# Patient Record
Sex: Female | Born: 1950 | Race: White | Hispanic: No | Marital: Married | Smoking: Never smoker
Health system: Southern US, Community
[De-identification: ages and names within clinical notes are randomized; demographics above are authoritative.]

## PROBLEM LIST (undated history)

## (undated) DIAGNOSIS — I10 Essential (primary) hypertension: Secondary | ICD-10-CM

## (undated) HISTORY — PX: ABDOMINAL HYSTERECTOMY: SHX81

## (undated) HISTORY — PX: APPENDECTOMY: SHX54

---

## 1973-12-11 HISTORY — PX: BREAST EXCISIONAL BIOPSY: SUR124

## 1999-07-26 ENCOUNTER — Ambulatory Visit (HOSPITAL_COMMUNITY): Admission: RE | Admit: 1999-07-26 | Discharge: 1999-07-26 | Payer: Self-pay | Admitting: Gastroenterology

## 1999-11-07 ENCOUNTER — Other Ambulatory Visit: Admission: RE | Admit: 1999-11-07 | Discharge: 1999-11-07 | Payer: Self-pay | Admitting: Gynecology

## 2000-01-26 ENCOUNTER — Other Ambulatory Visit: Admission: RE | Admit: 2000-01-26 | Discharge: 2000-01-26 | Payer: Self-pay | Admitting: Gynecology

## 2000-01-26 ENCOUNTER — Encounter (INDEPENDENT_AMBULATORY_CARE_PROVIDER_SITE_OTHER): Payer: Self-pay

## 2000-03-08 ENCOUNTER — Encounter: Payer: Self-pay | Admitting: Gynecology

## 2000-03-13 ENCOUNTER — Encounter (INDEPENDENT_AMBULATORY_CARE_PROVIDER_SITE_OTHER): Payer: Self-pay

## 2000-03-13 ENCOUNTER — Inpatient Hospital Stay (HOSPITAL_COMMUNITY): Admission: RE | Admit: 2000-03-13 | Discharge: 2000-03-14 | Payer: Self-pay | Admitting: Gynecology

## 2001-04-23 ENCOUNTER — Other Ambulatory Visit: Admission: RE | Admit: 2001-04-23 | Discharge: 2001-04-23 | Payer: Self-pay | Admitting: Gynecology

## 2001-08-15 ENCOUNTER — Emergency Department (HOSPITAL_COMMUNITY): Admission: EM | Admit: 2001-08-15 | Discharge: 2001-08-15 | Payer: Self-pay | Admitting: Emergency Medicine

## 2001-11-27 ENCOUNTER — Ambulatory Visit (HOSPITAL_COMMUNITY): Admission: RE | Admit: 2001-11-27 | Discharge: 2001-11-27 | Payer: Self-pay | Admitting: Gynecology

## 2001-11-27 ENCOUNTER — Encounter: Payer: Self-pay | Admitting: Gynecology

## 2002-02-21 ENCOUNTER — Ambulatory Visit (HOSPITAL_COMMUNITY): Admission: RE | Admit: 2002-02-21 | Discharge: 2002-02-21 | Payer: Self-pay | Admitting: Gastroenterology

## 2002-02-21 ENCOUNTER — Encounter (INDEPENDENT_AMBULATORY_CARE_PROVIDER_SITE_OTHER): Payer: Self-pay | Admitting: Specialist

## 2002-03-07 ENCOUNTER — Encounter: Payer: Self-pay | Admitting: Gastroenterology

## 2002-03-07 ENCOUNTER — Ambulatory Visit (HOSPITAL_COMMUNITY): Admission: RE | Admit: 2002-03-07 | Discharge: 2002-03-07 | Payer: Self-pay | Admitting: Gastroenterology

## 2002-05-07 ENCOUNTER — Other Ambulatory Visit: Admission: RE | Admit: 2002-05-07 | Discharge: 2002-05-07 | Payer: Self-pay | Admitting: Gynecology

## 2002-12-15 ENCOUNTER — Encounter: Payer: Self-pay | Admitting: Gynecology

## 2002-12-15 ENCOUNTER — Ambulatory Visit (HOSPITAL_COMMUNITY): Admission: RE | Admit: 2002-12-15 | Discharge: 2002-12-15 | Payer: Self-pay | Admitting: Gynecology

## 2002-12-18 ENCOUNTER — Encounter: Payer: Self-pay | Admitting: Gynecology

## 2002-12-18 ENCOUNTER — Encounter: Admission: RE | Admit: 2002-12-18 | Discharge: 2002-12-18 | Payer: Self-pay | Admitting: Gynecology

## 2003-05-25 ENCOUNTER — Other Ambulatory Visit: Admission: RE | Admit: 2003-05-25 | Discharge: 2003-05-25 | Payer: Self-pay | Admitting: Gynecology

## 2003-12-28 ENCOUNTER — Ambulatory Visit (HOSPITAL_COMMUNITY): Admission: RE | Admit: 2003-12-28 | Discharge: 2003-12-28 | Payer: Self-pay | Admitting: Gynecology

## 2004-06-29 ENCOUNTER — Other Ambulatory Visit: Admission: RE | Admit: 2004-06-29 | Discharge: 2004-06-29 | Payer: Self-pay | Admitting: Gynecology

## 2005-01-18 ENCOUNTER — Ambulatory Visit (HOSPITAL_COMMUNITY): Admission: RE | Admit: 2005-01-18 | Discharge: 2005-01-18 | Payer: Self-pay | Admitting: Gynecology

## 2005-08-17 ENCOUNTER — Other Ambulatory Visit: Admission: RE | Admit: 2005-08-17 | Discharge: 2005-08-17 | Payer: Self-pay | Admitting: Gynecology

## 2006-02-21 ENCOUNTER — Ambulatory Visit (HOSPITAL_COMMUNITY): Admission: RE | Admit: 2006-02-21 | Discharge: 2006-02-21 | Payer: Self-pay | Admitting: Gynecology

## 2006-03-22 ENCOUNTER — Encounter: Admission: RE | Admit: 2006-03-22 | Discharge: 2006-03-22 | Payer: Self-pay | Admitting: Gynecology

## 2006-09-09 ENCOUNTER — Emergency Department (HOSPITAL_COMMUNITY): Admission: EM | Admit: 2006-09-09 | Discharge: 2006-09-09 | Payer: Self-pay | Admitting: Emergency Medicine

## 2006-09-10 ENCOUNTER — Other Ambulatory Visit: Admission: RE | Admit: 2006-09-10 | Discharge: 2006-09-10 | Payer: Self-pay | Admitting: Gynecology

## 2006-09-27 ENCOUNTER — Encounter: Admission: RE | Admit: 2006-09-27 | Discharge: 2006-09-27 | Payer: Self-pay | Admitting: Emergency Medicine

## 2007-02-28 ENCOUNTER — Encounter: Admission: RE | Admit: 2007-02-28 | Discharge: 2007-02-28 | Payer: Self-pay | Admitting: Gynecology

## 2007-10-17 ENCOUNTER — Other Ambulatory Visit: Admission: RE | Admit: 2007-10-17 | Discharge: 2007-10-17 | Payer: Self-pay | Admitting: Gynecology

## 2008-03-26 ENCOUNTER — Encounter: Admission: RE | Admit: 2008-03-26 | Discharge: 2008-03-26 | Payer: Self-pay | Admitting: Gynecology

## 2008-04-08 ENCOUNTER — Encounter: Admission: RE | Admit: 2008-04-08 | Discharge: 2008-04-08 | Payer: Self-pay | Admitting: Gynecology

## 2008-12-28 ENCOUNTER — Other Ambulatory Visit: Admission: RE | Admit: 2008-12-28 | Discharge: 2008-12-28 | Payer: Self-pay | Admitting: Gynecology

## 2009-06-22 ENCOUNTER — Encounter: Admission: RE | Admit: 2009-06-22 | Discharge: 2009-06-22 | Payer: Self-pay | Admitting: Gynecology

## 2011-01-01 ENCOUNTER — Encounter: Payer: Self-pay | Admitting: Gynecology
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
## 2011-04-28 NOTE — Op Note (Signed)
[facility]  Patient:    Cheryl Gallegos, Cheryl Gallegos                  MRN: [id number] Proc. Date: 03/13/00 Attending:  Leatha Gilding. Mezer, M.D.                           Operative Report  PREOPERATIVE DIAGNOSES:  Postmenopausal bleeding, fibroid uterus.  POSTOPERATIVE DIAGNOSES:  Postmenopausal bleeding, fibroid uterus, adhesions.  PROCEDURE:  Total abdominal hysterectomy, bilateral salpingo-oophorectomy, and lysis of adhesions.  SURGEON:  Leatha Gilding. Mezer, M.D.  ASSISTANT:  Harl Bowie, M.D.  ANESTHESIA:  General endotracheal.  PREPARATION:  Betadine.  DESCRIPTION OF PROCEDURE:  This operative note was inadvertently not dictated at the time of surgery and is dictated late with the aid of the progress notes and will be dictated as usual and standard procedure without the particular details of this surgery.  With the patient in the supine position, she was prepped and draped in the routine fashion.  An incision was made through the skin and subcutaneous tissue.  The fascia and peritoneum were opened without difficulty.  The uterus was top normal in size with fibroids.  There were adhesions of bowel to the anterior peritoneum and bladder, and the tissue throughout was of very poor quality.  The adhesions were taken down to restore the operative field, and the round ligaments suture ligated with #1 chromic and divided with cautery.  The infundibulopelvic ligaments were isolated, taking care to locate the ureters.  The infundibulopelvic ligaments were clamped, cut, and free tied with #1 chromic and then suture ligated with #1 chromic.  The anterior leaf of the broad ligament is opened, the bladder flap taken down sharply and bluntly as indicated, and the uterine arteries clamped, cut, and suture ligated with #1 chromic.  The cardinal ligaments are taken in several bites, clamped, cut, and suture ligated with #1 chromic.  The vagina is entered from the best  and safest access, either anteriorly, posteriorly, or laterally, and the specimen excised with circumferential dissection.  _____ type angle sutures of #1 chromic are then placed, and the cuff is whipped anteriorly with running locked #1 chromic suture.  One or two anterior-posterior stitches are then taken to decrease the diameter of the opening of the vagina.  The bladder is then replaced over the vaginal cuff with a running 3-0 Vicryl suture.  The ureters are reinspected to be sure that they are out of harms way and with hemostasis intact, an effort is made to place the large bowel in the cul-de-sac.  The omentum is brought down.  The abdomen is closed in layers using a running 2-0 Vicryl on the peritoneum, running 0 Vicryl on the fascia.  Hemostasis is assured in the subcutaneous tissue, and the skin was closed with staples.  The estimated blood loss is 375 cc, with sponge, instrument, and needle counts correct x 2.  The patient tolerated the procedure well and was taken to the recovery room in satisfactory condition. DD:  08/09/00 TD:  08/10/00 Job: [id number]

## 2011-06-19 ENCOUNTER — Other Ambulatory Visit: Payer: Self-pay | Admitting: Family Medicine

## 2011-06-19 DIAGNOSIS — Z1231 Encounter for screening mammogram for malignant neoplasm of breast: Secondary | ICD-10-CM

## 2011-07-13 ENCOUNTER — Ambulatory Visit
Admission: RE | Admit: 2011-07-13 | Discharge: 2011-07-13 | Disposition: A | Discharge status: Home / Self Care | Payer: BC Managed Care – PPO | Source: Ambulatory Visit | Attending: Family Medicine | Admitting: Family Medicine

## 2011-07-13 DIAGNOSIS — Z1231 Encounter for screening mammogram for malignant neoplasm of breast: Secondary | ICD-10-CM

## 2012-10-31 ENCOUNTER — Other Ambulatory Visit: Payer: Self-pay | Admitting: Family Medicine

## 2012-10-31 DIAGNOSIS — Z1231 Encounter for screening mammogram for malignant neoplasm of breast: Secondary | ICD-10-CM

## 2012-12-12 ENCOUNTER — Ambulatory Visit
Admission: RE | Admit: 2012-12-12 | Discharge: 2012-12-12 | Disposition: A | Discharge status: Home / Self Care | Payer: BC Managed Care – PPO | Source: Ambulatory Visit | Attending: Family Medicine | Admitting: Family Medicine

## 2012-12-12 DIAGNOSIS — Z1231 Encounter for screening mammogram for malignant neoplasm of breast: Secondary | ICD-10-CM

## 2012-12-17 ENCOUNTER — Other Ambulatory Visit: Payer: Self-pay | Admitting: Family Medicine

## 2012-12-17 DIAGNOSIS — R928 Other abnormal and inconclusive findings on diagnostic imaging of breast: Secondary | ICD-10-CM

## 2012-12-24 ENCOUNTER — Ambulatory Visit
Admission: RE | Admit: 2012-12-24 | Discharge: 2012-12-24 | Disposition: A | Discharge status: Home / Self Care | Payer: BC Managed Care – PPO | Source: Ambulatory Visit | Attending: Family Medicine | Admitting: Family Medicine

## 2012-12-24 DIAGNOSIS — R928 Other abnormal and inconclusive findings on diagnostic imaging of breast: Secondary | ICD-10-CM

## 2015-03-24 ENCOUNTER — Other Ambulatory Visit: Payer: Self-pay

## 2015-03-24 DIAGNOSIS — Z1231 Encounter for screening mammogram for malignant neoplasm of breast: Secondary | ICD-10-CM

## 2015-03-31 ENCOUNTER — Ambulatory Visit
Admission: RE | Admit: 2015-03-31 | Discharge: 2015-03-31 | Disposition: A | Discharge status: Home / Self Care | Payer: BC Managed Care – PPO | Source: Ambulatory Visit

## 2015-03-31 DIAGNOSIS — Z1231 Encounter for screening mammogram for malignant neoplasm of breast: Secondary | ICD-10-CM

## 2016-02-24 ENCOUNTER — Other Ambulatory Visit: Payer: Self-pay

## 2016-02-24 DIAGNOSIS — Z1231 Encounter for screening mammogram for malignant neoplasm of breast: Secondary | ICD-10-CM

## 2016-04-11 ENCOUNTER — Ambulatory Visit
Admission: RE | Admit: 2016-04-11 | Discharge: 2016-04-11 | Disposition: A | Discharge status: Home / Self Care | Payer: BC Managed Care – PPO | Source: Ambulatory Visit

## 2016-04-11 DIAGNOSIS — Z1231 Encounter for screening mammogram for malignant neoplasm of breast: Secondary | ICD-10-CM

## 2017-03-02 ENCOUNTER — Other Ambulatory Visit: Payer: Self-pay | Admitting: Family Medicine

## 2017-03-02 DIAGNOSIS — Z1231 Encounter for screening mammogram for malignant neoplasm of breast: Secondary | ICD-10-CM

## 2017-04-12 ENCOUNTER — Ambulatory Visit
Admission: RE | Admit: 2017-04-12 | Discharge: 2017-04-12 | Disposition: A | Discharge status: Home / Self Care | Payer: BC Managed Care – PPO | Source: Ambulatory Visit | Attending: Family Medicine | Admitting: Family Medicine

## 2017-04-12 DIAGNOSIS — Z1231 Encounter for screening mammogram for malignant neoplasm of breast: Secondary | ICD-10-CM

## 2018-04-01 ENCOUNTER — Other Ambulatory Visit: Payer: Self-pay | Admitting: Family Medicine

## 2018-04-01 DIAGNOSIS — Z1231 Encounter for screening mammogram for malignant neoplasm of breast: Secondary | ICD-10-CM

## 2018-04-18 ENCOUNTER — Ambulatory Visit
Admission: RE | Admit: 2018-04-18 | Discharge: 2018-04-18 | Disposition: A | Discharge status: Home / Self Care | Payer: BC Managed Care – PPO | Source: Ambulatory Visit | Attending: Family Medicine | Admitting: Family Medicine

## 2018-04-18 DIAGNOSIS — Z1231 Encounter for screening mammogram for malignant neoplasm of breast: Secondary | ICD-10-CM

## 2019-01-17 ENCOUNTER — Ambulatory Visit
Admission: RE | Admit: 2019-01-17 | Discharge: 2019-01-17 | Disposition: A | Discharge status: Home / Self Care | Payer: BC Managed Care – PPO | Source: Ambulatory Visit | Attending: Family Medicine | Admitting: Family Medicine

## 2019-01-17 ENCOUNTER — Other Ambulatory Visit: Payer: Self-pay | Admitting: Family Medicine

## 2019-01-17 DIAGNOSIS — R52 Pain, unspecified: Secondary | ICD-10-CM

## 2019-05-26 ENCOUNTER — Other Ambulatory Visit: Payer: Self-pay | Admitting: Family Medicine

## 2019-05-26 DIAGNOSIS — Z1231 Encounter for screening mammogram for malignant neoplasm of breast: Secondary | ICD-10-CM

## 2019-06-11 ENCOUNTER — Ambulatory Visit
Admission: RE | Admit: 2019-06-11 | Discharge: 2019-06-11 | Disposition: A | Discharge status: Home / Self Care | Payer: BC Managed Care – PPO | Source: Ambulatory Visit | Attending: Family Medicine | Admitting: Family Medicine

## 2019-06-11 ENCOUNTER — Other Ambulatory Visit: Payer: Self-pay

## 2019-06-11 DIAGNOSIS — Z1231 Encounter for screening mammogram for malignant neoplasm of breast: Secondary | ICD-10-CM

## 2020-04-30 ENCOUNTER — Other Ambulatory Visit: Payer: Self-pay | Admitting: Family Medicine

## 2020-04-30 DIAGNOSIS — Z1231 Encounter for screening mammogram for malignant neoplasm of breast: Secondary | ICD-10-CM

## 2020-06-21 ENCOUNTER — Ambulatory Visit: Payer: BC Managed Care – PPO

## 2020-06-23 ENCOUNTER — Ambulatory Visit
Admission: RE | Admit: 2020-06-23 | Discharge: 2020-06-23 | Disposition: A | Discharge status: Home / Self Care | Payer: BC Managed Care – PPO | Source: Ambulatory Visit | Attending: Family Medicine | Admitting: Family Medicine

## 2020-06-23 DIAGNOSIS — Z1231 Encounter for screening mammogram for malignant neoplasm of breast: Secondary | ICD-10-CM

## 2021-05-13 ENCOUNTER — Other Ambulatory Visit: Payer: Self-pay | Admitting: Family Medicine

## 2021-05-13 DIAGNOSIS — Z1231 Encounter for screening mammogram for malignant neoplasm of breast: Secondary | ICD-10-CM

## 2021-07-11 ENCOUNTER — Ambulatory Visit
Admission: RE | Admit: 2021-07-11 | Discharge: 2021-07-11 | Disposition: A | Discharge status: Home / Self Care | Payer: BC Managed Care – PPO | Source: Ambulatory Visit | Attending: Family Medicine | Admitting: Family Medicine

## 2021-07-11 ENCOUNTER — Other Ambulatory Visit: Payer: Self-pay

## 2021-07-11 DIAGNOSIS — Z1231 Encounter for screening mammogram for malignant neoplasm of breast: Secondary | ICD-10-CM

## 2022-02-24 ENCOUNTER — Other Ambulatory Visit: Payer: Self-pay | Admitting: Family Medicine

## 2022-02-24 DIAGNOSIS — Z1231 Encounter for screening mammogram for malignant neoplasm of breast: Secondary | ICD-10-CM

## 2022-03-06 ENCOUNTER — Other Ambulatory Visit: Payer: Self-pay | Admitting: Family Medicine

## 2022-03-06 DIAGNOSIS — N6321 Unspecified lump in the left breast, upper outer quadrant: Secondary | ICD-10-CM

## 2022-03-20 ENCOUNTER — Ambulatory Visit
Admission: RE | Admit: 2022-03-20 | Discharge: 2022-03-20 | Disposition: A | Discharge status: Home / Self Care | Payer: BC Managed Care – PPO | Source: Ambulatory Visit | Attending: Family Medicine | Admitting: Family Medicine

## 2022-03-20 DIAGNOSIS — N6321 Unspecified lump in the left breast, upper outer quadrant: Secondary | ICD-10-CM

## 2022-06-09 ENCOUNTER — Other Ambulatory Visit: Payer: Self-pay | Admitting: Internal Medicine

## 2022-06-09 DIAGNOSIS — M8588 Other specified disorders of bone density and structure, other site: Secondary | ICD-10-CM

## 2022-06-09 DIAGNOSIS — Z87828 Personal history of other (healed) physical injury and trauma: Secondary | ICD-10-CM

## 2022-07-12 ENCOUNTER — Ambulatory Visit: Payer: BC Managed Care – PPO

## 2022-07-18 ENCOUNTER — Ambulatory Visit
Admission: RE | Admit: 2022-07-18 | Discharge: 2022-07-18 | Disposition: A | Discharge status: Home / Self Care | Payer: BC Managed Care – PPO | Source: Ambulatory Visit | Attending: Family Medicine | Admitting: Family Medicine

## 2022-07-18 DIAGNOSIS — Z1231 Encounter for screening mammogram for malignant neoplasm of breast: Secondary | ICD-10-CM

## 2022-09-25 ENCOUNTER — Encounter (HOSPITAL_BASED_OUTPATIENT_CLINIC_OR_DEPARTMENT_OTHER): Payer: Self-pay | Admitting: Emergency Medicine

## 2022-09-25 ENCOUNTER — Other Ambulatory Visit: Payer: Self-pay

## 2022-09-25 ENCOUNTER — Emergency Department (HOSPITAL_BASED_OUTPATIENT_CLINIC_OR_DEPARTMENT_OTHER)
Admission: EM | Admit: 2022-09-25 | Discharge: 2022-09-25 | Disposition: A | Discharge status: Home / Self Care | Payer: BC Managed Care – PPO | Attending: Emergency Medicine | Admitting: Emergency Medicine

## 2022-09-25 DIAGNOSIS — Y93K1 Activity, walking an animal: Secondary | ICD-10-CM | POA: Insufficient documentation

## 2022-09-25 DIAGNOSIS — W01198A Fall on same level from slipping, tripping and stumbling with subsequent striking against other object, initial encounter: Secondary | ICD-10-CM | POA: Diagnosis not present

## 2022-09-25 DIAGNOSIS — S0592XA Unspecified injury of left eye and orbit, initial encounter: Secondary | ICD-10-CM | POA: Diagnosis present

## 2022-09-25 DIAGNOSIS — S80212A Abrasion, left knee, initial encounter: Secondary | ICD-10-CM

## 2022-09-25 DIAGNOSIS — S01112A Laceration without foreign body of left eyelid and periocular area, initial encounter: Secondary | ICD-10-CM | POA: Insufficient documentation

## 2022-09-25 HISTORY — DX: Essential (primary) hypertension: I10

## 2022-09-25 MED ORDER — BACITRACIN ZINC 500 UNIT/GM EX OINT: TOPICAL_OINTMENT | CUTANEOUS | Status: AC

## 2022-09-25 MED ORDER — LIDOCAINE HCL (PF) 1 % IJ SOLN
10.0000 mL | Freq: Once | INTRAMUSCULAR | Status: AC
  Administered 2022-09-25: 10 mL via INTRADERMAL
  Filled 2022-09-25: qty 10

## 2022-09-25 NOTE — ED Notes (Signed)
Reviewed discharge instructions and laceration care with pt.Pt states understanding  Left brow and left knee cleaned with NS, bacitracin and dry drsg applied. Pt ambulatory for discharge. Accompanied by husband

## 2022-09-25 NOTE — Discharge Instructions (Signed)
Sutured repair Keep the laceration site dry for the next 24 hours and leave the dressing in place. After 24 hours you may remove the dressing and gently clean the laceration site with antibacterial soap and warm water. Do not scrub the area. Do not soak the area and water for long periods of time. Don't use hydrogen peroxide, iodine-based solutions, or alcohol, which can slow healing, and will probably be painful! Apply topical bacitracin 1-2 times per day for the next 3-5 days. Return to the emergency department in 5-7 days for removal of the sutures.  You should return sooner for any signs of infection which would include increased redness around the wound, increased swelling, new drainage of yellow pus.   

## 2022-09-25 NOTE — ED Notes (Signed)
Cleaned wound above left eye

## 2022-09-25 NOTE — ED Provider Notes (Signed)
..  Laceration Repair  Date/Time: 09/25/2022 3:32 PM  Performed by: Tedd Sias, PA Authorized by: Tedd Sias, PA   Consent:    Consent obtained:  Verbal   Consent given by:  Patient   Risks discussed:  Infection, need for additional repair, pain, poor cosmetic result and poor wound healing   Alternatives discussed:  No treatment and delayed treatment Universal protocol:    Procedure explained and questions answered to patient or proxy's satisfaction: yes     Relevant documents present and verified: yes     Test results available: yes     Imaging studies available: yes     Required blood products, implants, devices, and special equipment available: yes     Site/side marked: yes     Immediately prior to procedure, a time out was called: yes     Patient identity confirmed:  Verbally with patient Anesthesia:    Anesthesia method:  Local infiltration   Local anesthetic:  Lidocaine 1% w/o epi Laceration details:    Location:  Face   Face location:  L eyebrow   Length (cm):  3 Exploration:    Hemostasis achieved with:  Direct pressure   Imaging outcome: foreign body not noted     Wound exploration: wound explored through full range of motion     Wound extent: no foreign bodies/material noted and no tendon damage noted     Contaminated: no   Treatment:    Area cleansed with:  Saline   Amount of cleaning:  Standard   Irrigation solution:  Sterile saline   Irrigation volume:  20 cc   Irrigation method:  Pressure wash and syringe   Visualized foreign bodies/material removed: no   Skin repair:    Repair method:  Sutures   Suture size:  5-0   Suture material:  Prolene   Suture technique:  Simple interrupted   Number of sutures:  3 Approximation:    Approximation:  Close Repair type:    Repair type:  Intermediate Post-procedure details:    Dressing:  Antibiotic ointment and non-adherent dressing   Procedure completion:  Tolerated well, no immediate complications      Tedd Sias, PA 09/25/22 1533    Dorie Rank, MD 09/25/22 [phone]

## 2022-09-25 NOTE — ED Provider Notes (Signed)
[facility] EMERGENCY DEPARTMENT Provider Note   CSN: [id number] Arrival date & time: 09/25/22  1330     History  Chief Complaint  Patient presents with   Cheryl Gallegos is a 71 y.o. female.   Fall     Patient presents to the ED for evaluation of a laceration above her eye.  Patient states she was walking her dog when the dog went to chase a squirrel and she ended up getting pulled to the ground.  Patient fell striking her head on the ground sustaining a laceration.  She denies any loss of consciousness.  She is not on anticoagulation.  She denies any neck pain.  She has some chronic left shoulder pain but denies any acute issues.  She denies any pain in her extremities otherwise.  No trouble in her legs.  She was able to walk without difficulty.  Home Medications Prior to Admission medications   Not on File      Allergies    Patient has no allergy information on record.    Review of Systems   Review of Systems  Physical Exam Updated Vital Signs BP 131/66   Pulse 86   Temp 98.7 F (37.1 C)   Resp 18   Ht 1.778 m (5\' 10" )   Wt 62.6 kg   SpO2 98%   BMI 19.80 kg/m  Physical Exam Vitals and nursing note reviewed.  Constitutional:      General: She is not in acute distress.    Appearance: She is well-developed.  HENT:     Head: Normocephalic.     Comments: Laceration within and superior to the left eyebrow, 3 cm in length    Right Ear: External ear normal.     Left Ear: External ear normal.  Eyes:     General: No scleral icterus.       Right eye: No discharge.        Left eye: No discharge.     Conjunctiva/sclera: Conjunctivae normal.  Neck:     Trachea: No tracheal deviation.  Cardiovascular:     Rate and Rhythm: Normal rate.  Pulmonary:     Effort: Pulmonary effort is normal. No respiratory distress.     Breath sounds: No stridor.  Abdominal:     General: There is no distension.  Musculoskeletal:        General: No swelling  or deformity.     Cervical back: Normal and neck supple. No tenderness.     Comments: Full range of motion bilateral upper extremities and lower extremities without pain or discomfort  Skin:    General: Skin is warm and dry.     Findings: No rash.  Neurological:     Mental Status: She is alert.     Cranial Nerves: Cranial nerve deficit: no gross deficits.     ED Results / Procedures / Treatments   Labs (all labs ordered are listed, but only abnormal results are displayed) Labs Reviewed - No data to display  EKG None  Radiology No results found.  Procedures Procedures    Medications Ordered in ED Medications  bacitracin ointment (has no administration in time range)  lidocaine (PF) (XYLOCAINE) 1 % injection 10 mL (10 mLs Intradermal Given by Other 09/25/22 1511)    ED Course/ Medical Decision Making/ A&P  Medical Decision Making Problems Addressed: Knee abrasion, left, initial encounter: acute illness or injury Laceration of left eyebrow, initial encounter: acute illness or injury  Risk Prescription drug management.   Pt with laceration to eyebrow.  Repaired in the ED.  No signs of serious head injury.  Mild abrasion to knee but no ttp, no difficulty walking.  No ttp.  Do not feel xrays are necessary.  Pt agrees.  Evaluation and diagnostic testing in the emergency department does not suggest an emergent condition requiring admission or immediate intervention beyond what has been performed at this time.  The patient is safe for discharge and has been instructed to return immediately for worsening symptoms, change in symptoms or any other concerns.         Final Clinical Impression(s) / ED Diagnoses Final diagnoses:  Laceration of left eyebrow, initial encounter  Knee abrasion, left, initial encounter    Rx / DC Orders ED Discharge Orders     None         Dorie Rank, MD 09/25/22 1553

## 2022-09-25 NOTE — ED Triage Notes (Signed)
Pt arrives pov, slow gait, endorses mechanical fall when walking dog pta.  Pt presents with laceration to LT eyebrow, bleeding controlled. Denies loc or thinners, denies neck pain Also endorses LT shoulder  pain, will wait until seen by provider for XR per pt request.  AO x 4. Ice pack provided

## 2022-11-21 ENCOUNTER — Ambulatory Visit
Admission: RE | Admit: 2022-11-21 | Discharge: 2022-11-21 | Disposition: A | Discharge status: Home / Self Care | Payer: BC Managed Care – PPO | Source: Ambulatory Visit | Attending: Internal Medicine | Admitting: Internal Medicine

## 2022-11-21 DIAGNOSIS — M8588 Other specified disorders of bone density and structure, other site: Secondary | ICD-10-CM

## 2022-11-21 DIAGNOSIS — Z87828 Personal history of other (healed) physical injury and trauma: Secondary | ICD-10-CM

## 2023-02-18 ENCOUNTER — Emergency Department (HOSPITAL_COMMUNITY): Payer: BC Managed Care – PPO

## 2023-02-18 ENCOUNTER — Other Ambulatory Visit: Payer: Self-pay

## 2023-02-18 ENCOUNTER — Observation Stay (HOSPITAL_COMMUNITY)
Admission: EM | Admit: 2023-02-18 | Discharge: 2023-02-19 | Disposition: A | Discharge status: Home / Self Care | Payer: BC Managed Care – PPO | Attending: Internal Medicine | Admitting: Internal Medicine

## 2023-02-18 DIAGNOSIS — I10 Essential (primary) hypertension: Secondary | ICD-10-CM | POA: Diagnosis not present

## 2023-02-18 DIAGNOSIS — G459 Transient cerebral ischemic attack, unspecified: Secondary | ICD-10-CM | POA: Diagnosis not present

## 2023-02-18 DIAGNOSIS — K219 Gastro-esophageal reflux disease without esophagitis: Secondary | ICD-10-CM

## 2023-02-18 DIAGNOSIS — Z79899 Other long term (current) drug therapy: Secondary | ICD-10-CM | POA: Diagnosis not present

## 2023-02-18 DIAGNOSIS — R4701 Aphasia: Principal | ICD-10-CM | POA: Insufficient documentation

## 2023-02-18 LAB — APTT: aPTT: 27 seconds (ref 24–36)

## 2023-02-18 LAB — URINALYSIS, ROUTINE W REFLEX MICROSCOPIC
Bilirubin Urine: NEGATIVE
Glucose, UA: NEGATIVE mg/dL
Hgb urine dipstick: NEGATIVE
Ketones, ur: NEGATIVE mg/dL
Nitrite: NEGATIVE
Protein, ur: NEGATIVE mg/dL
Specific Gravity, Urine: 1.008 (ref 1.005–1.030)
WBC, UA: >50 WBC/hpf (ref 0–5)
pH: 6 (ref 5.0–8.0)

## 2023-02-18 LAB — DIFFERENTIAL
Abs Immature Granulocytes: 0.02 10*3/uL (ref 0.00–0.07)
Basophils Absolute: 0 10*3/uL (ref 0.0–0.1)
Basophils Relative: 0 %
Eosinophils Absolute: 0.1 10*3/uL (ref 0.0–0.5)
Eosinophils Relative: 1 %
Immature Granulocytes: 0 %
Lymphocytes Relative: 13 %
Lymphs Abs: 1.2 10*3/uL (ref 0.7–4.0)
Monocytes Absolute: 0.7 10*3/uL (ref 0.1–1.0)
Monocytes Relative: 8 %
Neutro Abs: 6.7 10*3/uL (ref 1.7–7.7)
Neutrophils Relative %: 78 %

## 2023-02-18 LAB — RAPID URINE DRUG SCREEN, HOSP PERFORMED
Amphetamines: NOT DETECTED
Barbiturates: NOT DETECTED
Benzodiazepines: NOT DETECTED
Cocaine: NOT DETECTED
Opiates: NOT DETECTED
Tetrahydrocannabinol: NOT DETECTED

## 2023-02-18 LAB — COMPREHENSIVE METABOLIC PANEL
ALT: 15 U/L (ref 0–44)
AST: 21 U/L (ref 15–41)
Albumin: 4.2 g/dL (ref 3.5–5.0)
Alkaline Phosphatase: 51 U/L (ref 38–126)
Anion gap: 9 (ref 5–15)
BUN: 16 mg/dL (ref 8–23)
CO2: 22 mmol/L (ref 22–32)
Calcium: 9.2 mg/dL (ref 8.9–10.3)
Chloride: 105 mmol/L (ref 98–111)
Creatinine, Ser: 0.89 mg/dL (ref 0.44–1.00)
GFR, Estimated: >60 mL/min (ref >60)
Glucose, Bld: 121 mg/dL — ABNORMAL HIGH (ref 70–99)
Potassium: 4 mmol/L (ref 3.5–5.1)
Sodium: 136 mmol/L (ref 135–145)
Total Bilirubin: 0.6 mg/dL (ref 0.3–1.2)
Total Protein: 6.5 g/dL (ref 6.5–8.1)

## 2023-02-18 LAB — CBC
HCT: 42 % (ref 36.0–46.0)
Hemoglobin: 14.3 g/dL (ref 12.0–15.0)
MCH: 32.1 pg (ref 26.0–34.0)
MCHC: 34 g/dL (ref 30.0–36.0)
MCV: 94.4 fL (ref 80.0–100.0)
Platelets: 264 10*3/uL (ref 150–400)
RBC: 4.45 MIL/uL (ref 3.87–5.11)
RDW: 12.4 % (ref 11.5–15.5)
WBC: 8.7 10*3/uL (ref 4.0–10.5)
nRBC: 0 % (ref 0.0–0.2)

## 2023-02-18 LAB — I-STAT CHEM 8, ED
BUN: 18 mg/dL (ref 8–23)
Calcium, Ion: 1.15 mmol/L (ref 1.15–1.40)
Chloride: 103 mmol/L (ref 98–111)
Creatinine, Ser: 0.9 mg/dL (ref 0.44–1.00)
Glucose, Bld: 118 mg/dL — ABNORMAL HIGH (ref 70–99)
HCT: 42 % (ref 36.0–46.0)
Hemoglobin: 14.3 g/dL (ref 12.0–15.0)
Potassium: 4 mmol/L (ref 3.5–5.1)
Sodium: 138 mmol/L (ref 135–145)
TCO2: 24 mmol/L (ref 22–32)

## 2023-02-18 LAB — PROTIME-INR
INR: 1 (ref 0.8–1.2)
Prothrombin Time: 12.9 seconds (ref 11.4–15.2)

## 2023-02-18 LAB — ETHANOL: Alcohol, Ethyl (B): <10 mg/dL (ref <10)

## 2023-02-18 MED ORDER — SENNOSIDES-DOCUSATE SODIUM 8.6-50 MG PO TABS: 1.0000 | ORAL_TABLET | Freq: Every evening | ORAL | Status: DC | PRN

## 2023-02-18 MED ORDER — STROKE: EARLY STAGES OF RECOVERY BOOK
Freq: Once | Status: AC
  Administered 2023-02-19: 1
  Filled 2023-02-18: qty 1

## 2023-02-18 MED ORDER — ACETAMINOPHEN 325 MG PO TABS: 650.0000 mg | ORAL_TABLET | ORAL | Status: DC | PRN

## 2023-02-18 MED ORDER — ASPIRIN 300 MG RE SUPP: 300.0000 mg | Freq: Every day | RECTAL | Status: DC

## 2023-02-18 MED ORDER — ACETAMINOPHEN 650 MG RE SUPP: 650.0000 mg | RECTAL | Status: DC | PRN

## 2023-02-18 MED ORDER — ASPIRIN 325 MG PO TABS
325.0000 mg | ORAL_TABLET | Freq: Every day | ORAL | Status: DC
  Administered 2023-02-19: 325 mg via ORAL
  Filled 2023-02-18: qty 1

## 2023-02-18 MED ORDER — ENOXAPARIN SODIUM 40 MG/0.4ML IJ SOSY
40.0000 mg | PREFILLED_SYRINGE | INTRAMUSCULAR | Status: DC
  Administered 2023-02-18: 40 mg via SUBCUTANEOUS
  Filled 2023-02-18: qty 0.4

## 2023-02-18 MED ORDER — ACETAMINOPHEN 160 MG/5ML PO SOLN: 650.0000 mg | ORAL | Status: DC | PRN

## 2023-02-18 MED ORDER — PANTOPRAZOLE SODIUM 40 MG PO TBEC
40.0000 mg | DELAYED_RELEASE_TABLET | Freq: Every day | ORAL | Status: DC
  Administered 2023-02-19: 40 mg via ORAL
  Filled 2023-02-18: qty 1
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
## 2023-02-18 NOTE — Assessment & Plan Note (Signed)
-  mildly elevated -hold home Lisinopril and amlodipine to allow for permissive HTN. Has already taken her daily doses today however.

## 2023-02-18 NOTE — Assessment & Plan Note (Signed)
-  continue Prilosec
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
## 2023-02-18 NOTE — ED Notes (Signed)
Patient ambulatory to rr and back with steady gait

## 2023-02-18 NOTE — ED Triage Notes (Signed)
[facility]IB EMS [facility]. Per EMS, pt was typing on her phone @ 1300 today. Pt had trouble saying the letters "f" and "t" and trouble with coordination with typing on phone. Stroke screen negative for EMS. Pt denies any other symptoms. A/Ox4. Ambulatory.

## 2023-02-18 NOTE — ED Notes (Signed)
Per patient, she could only say "f" and "t", no vowels or other letters.

## 2023-02-18 NOTE — ED Notes (Signed)
Patient transported to CT 
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
## 2023-02-19 ENCOUNTER — Observation Stay (HOSPITAL_COMMUNITY): Payer: BC Managed Care – PPO

## 2023-02-19 ENCOUNTER — Encounter (HOSPITAL_COMMUNITY): Payer: Self-pay | Admitting: Family Medicine

## 2023-02-19 ENCOUNTER — Other Ambulatory Visit: Payer: Self-pay | Admitting: Home Health

## 2023-02-19 ENCOUNTER — Observation Stay (HOSPITAL_BASED_OUTPATIENT_CLINIC_OR_DEPARTMENT_OTHER): Payer: BC Managed Care – PPO

## 2023-02-19 DIAGNOSIS — I491 Atrial premature depolarization: Secondary | ICD-10-CM

## 2023-02-19 DIAGNOSIS — I480 Paroxysmal atrial fibrillation: Secondary | ICD-10-CM

## 2023-02-19 DIAGNOSIS — G459 Transient cerebral ischemic attack, unspecified: Secondary | ICD-10-CM

## 2023-02-19 DIAGNOSIS — R4701 Aphasia: Secondary | ICD-10-CM | POA: Diagnosis not present

## 2023-02-19 LAB — LIPID PANEL
Cholesterol: 204 mg/dL — ABNORMAL HIGH (ref 0–200)
HDL: 88 mg/dL (ref >40)
LDL Cholesterol: 108 mg/dL — ABNORMAL HIGH (ref 0–99)
Total CHOL/HDL Ratio: 2.3 RATIO
Triglycerides: 41 mg/dL (ref <150)
VLDL: 8 mg/dL (ref 0–40)

## 2023-02-19 LAB — ECHOCARDIOGRAM COMPLETE
AR max vel: 2.52 cm2
AV Area VTI: 2.4 cm2
AV Area mean vel: 2.44 cm2
AV Mean grad: 3 mmHg
AV Peak grad: 5.2 mmHg
Ao pk vel: 1.15 m/s
Area-P 1/2: 3.72 cm2
S' Lateral: 3.1 cm

## 2023-02-19 LAB — HEMOGLOBIN A1C
Hgb A1c MFr Bld: 5.5 % (ref 4.8–5.6)
Mean Plasma Glucose: 111 mg/dL

## 2023-02-19 MED ORDER — ASPIRIN 81 MG PO CHEW: 81.0000 mg | CHEWABLE_TABLET | Freq: Every day | ORAL | Status: DC

## 2023-02-19 MED ORDER — ASPIRIN 81 MG PO TBEC: 81.0000 mg | DELAYED_RELEASE_TABLET | Freq: Every day | ORAL | 2 refills | Status: DC

## 2023-02-19 MED ORDER — CEPHALEXIN 500 MG PO CAPS: 500.0000 mg | ORAL_CAPSULE | Freq: Two times a day (BID) | ORAL | 0 refills | Status: AC

## 2023-02-19 MED ORDER — IOHEXOL 350 MG/ML SOLN
75.0000 mL | Freq: Once | INTRAVENOUS | Status: AC | PRN
  Administered 2023-02-19: 75 mL via INTRAVENOUS

## 2023-02-19 MED ORDER — CEPHALEXIN 250 MG PO CAPS
500.0000 mg | ORAL_CAPSULE | Freq: Two times a day (BID) | ORAL | Status: DC
  Administered 2023-02-19: 500 mg via ORAL
  Filled 2023-02-19: qty 2

## 2023-02-19 MED ORDER — ROSUVASTATIN CALCIUM 20 MG PO TABS
20.0000 mg | ORAL_TABLET | Freq: Every day | ORAL | Status: DC
  Administered 2023-02-19: 20 mg via ORAL
  Filled 2023-02-19: qty 1

## 2023-02-19 MED ORDER — CLOPIDOGREL BISULFATE 75 MG PO TABS
75.0000 mg | ORAL_TABLET | Freq: Every day | ORAL | Status: DC
  Administered 2023-02-19: 75 mg via ORAL
  Filled 2023-02-19: qty 1

## 2023-02-19 MED ORDER — ROSUVASTATIN CALCIUM 20 MG PO TABS: 20.0000 mg | ORAL_TABLET | Freq: Every day | ORAL | 0 refills | Status: AC

## 2023-02-19 MED ORDER — CLOPIDOGREL BISULFATE 75 MG PO TABS: 75.0000 mg | ORAL_TABLET | Freq: Every day | ORAL | 0 refills | Status: AC

## 2023-02-19 MED ORDER — LISINOPRIL 20 MG PO TABS: 20.0000 mg | ORAL_TABLET | Freq: Every day | ORAL | Status: AC

## 2023-02-19 NOTE — Progress Notes (Signed)
30 days event monitor ordered for TIA per neuro request, staff will mail supplies and instruction to the patient, new patient appointment with cardiology made with Dr Gasper Sells.

## 2023-02-19 NOTE — ED Notes (Signed)
Patient ambulatory to rr and back with steady gait

## 2023-02-19 NOTE — Progress Notes (Signed)
SLP Cancellation Note  Patient Details Name: Cheryl Gallegos MRN: [id number] DOB: 09-28-1951   Cancelled treatment:       Reason Eval/Treat Not Completed: SLP screened, no needs identified, will sign off. Per EMR, pt's symptoms of aphasia and dysarthria have resolved and neurology has diagnosed her with a TIA. No communication impairments noted by OT/RN to suggest need for SLP evaluation; SLP will sign off.   Keiera Strathman I. Hardin Negus, [place], [place] Office number [phone]  Horton Marshall 02/19/2023, 3:13 PM
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
## 2023-02-19 NOTE — ED Notes (Signed)
ED TO INPATIENT HANDOFF REPORT  ED Nurse Name and Phone #: [phone]  S Name/Age/Gender Cheryl Gallegos 72 y.o. female Room/Bed: 037C/037C  Code Status   Code Status: Full Code  Home/SNF/Other Home Patient oriented to: self, place, time, and situation Is this baseline? Yes   Triage Complete: Triage complete  Chief Complaint Expressive aphasia [R47.01]  Triage Note Pt BIB EMS from the fire department. Per EMS, pt was typing on her phone @ 1300 today. Pt had trouble saying the letters "f" and "t" and trouble with coordination with typing on phone. Stroke screen negative for EMS. Pt denies any other symptoms. A/Ox4. Ambulatory.    Allergies Allergies  Allergen Reactions   Beta Adrenergic Blockers Other (See Comments)    Orthostatic hypotension   Ibuprofen Swelling   Tomato Hives   Chocolate Swelling and Rash    Level of Care/Admitting Diagnosis ED Disposition     ED Disposition  Admit   Condition  --   [place]: [facility] [[id number]]  Level of Care: Telemetry Medical [104]  May place patient in observation at [facility] or [facility] if equivalent level of care is available:: No  Covid Evaluation: Asymptomatic - no recent exposure (last 10 days) testing not required  Diagnosis: Expressive aphasia [637005]  Admitting Physician: Orene Desanctis [id number]  Attending Physician: Orene Desanctis [id number]          B Medical/Surgery History Past Medical History:  Diagnosis Date   Hypertension    Past Surgical History:  Procedure Laterality Date   ABDOMINAL HYSTERECTOMY     APPENDECTOMY     BREAST EXCISIONAL BIOPSY Left 1975   benign     A IV Location/Drains/Wounds Patient Lines/Drains/Airways Status     Active Line/Drains/Airways     Name Placement date Placement time Site Days   Peripheral IV 02/18/23 18 G Posterior;Right Forearm 02/18/23  1442  Forearm  1            Intake/Output Last 24 hours No intake or output  data in the 24 hours ending 02/19/23 1211  Labs/Imaging Results for orders placed or performed during the hospital encounter of 02/18/23 (from the past 48 hour(s))  Ethanol     Status: None   Collection Time: 02/18/23  2:55 PM  Result Value Ref Range   Alcohol, Ethyl (B) <10 <10 mg/dL    Comment: (NOTE) Lowest detectable limit for serum alcohol is 10 mg/dL.  For medical purposes only. Performed at [facility], [place] [address], [place], [place] [address]   Protime-INR     Status: None   Collection Time: 02/18/23  2:55 PM  Result Value Ref Range   Prothrombin Time 12.9 11.4 - 15.2 seconds   INR 1.0 0.8 - 1.2    Comment: (NOTE) INR goal varies based on device and disease states. Performed at [facility], [place] [address], [place], [place] [address]   APTT     Status: None   Collection Time: 02/18/23  2:55 PM  Result Value Ref Range   aPTT 27 24 - 36 seconds    Comment: Performed at [facility] [address], [place] [address]  CBC     Status: None   Collection Time: 02/18/23  2:55 PM  Result Value Ref Range   WBC 8.7 4.0 - 10.5 K/uL   RBC 4.45 3.87 - 5.11 MIL/uL   Hemoglobin 14.3 12.0 - 15.0 g/dL  HCT 42.0 36.0 - 46.0 %   MCV 94.4 80.0 - 100.0 fL   MCH 32.1 26.0 - 34.0 pg   MCHC 34.0 30.0 - 36.0 g/dL   RDW 12.4 11.5 - 15.5 %   Platelets 264 150 - 400 K/uL   nRBC 0.0 0.0 - 0.2 %    Comment: Performed at [facility], [place] [address], [place], [place] [address]  Differential     Status: None   Collection Time: 02/18/23  2:55 PM  Result Value Ref Range   Neutrophils Relative % 78 %   Neutro Abs 6.7 1.7 - 7.7 K/uL   Lymphocytes Relative 13 %   Lymphs Abs 1.2 0.7 - 4.0 K/uL   Monocytes Relative 8 %   Monocytes Absolute 0.7 0.1 - 1.0 K/uL   Eosinophils Relative 1 %   Eosinophils Absolute 0.1 0.0 - 0.5 K/uL   Basophils Relative 0 %   Basophils Absolute 0.0 0.0 - 0.1 K/uL   Immature Granulocytes 0 %   Abs Immature Granulocytes 0.02  0.00 - 0.07 K/uL    Comment: Performed at [facility], [place] [address], [place], [place] [address]  Comprehensive metabolic panel     Status: Abnormal   Collection Time: 02/18/23  2:55 PM  Result Value Ref Range   Sodium 136 135 - 145 mmol/L   Potassium 4.0 3.5 - 5.1 mmol/L   Chloride 105 98 - 111 mmol/L   CO2 22 22 - 32 mmol/L   Glucose, Bld 121 (H) 70 - 99 mg/dL    Comment: Glucose reference range applies only to samples taken after fasting for at least 8 hours.   BUN 16 8 - 23 mg/dL   Creatinine, Ser 0.89 0.44 - 1.00 mg/dL   Calcium 9.2 8.9 - 10.3 mg/dL   Total Protein 6.5 6.5 - 8.1 g/dL   Albumin 4.2 3.5 - 5.0 g/dL   AST 21 15 - 41 U/L   ALT 15 0 - 44 U/L   Alkaline Phosphatase 51 38 - 126 U/L   Total Bilirubin 0.6 0.3 - 1.2 mg/dL   GFR, Estimated >60 >60 mL/min    Comment: (NOTE) Calculated using the CKD-EPI Creatinine Equation (2021)    Anion gap 9 5 - 15    Comment: Performed at [facility] [address], [place], [place] [address]  I-stat chem 8, ED     Status: Abnormal   Collection Time: 02/18/23  3:21 PM  Result Value Ref Range   Sodium 138 135 - 145 mmol/L   Potassium 4.0 3.5 - 5.1 mmol/L   Chloride 103 98 - 111 mmol/L   BUN 18 8 - 23 mg/dL   Creatinine, Ser 0.90 0.44 - 1.00 mg/dL   Glucose, Bld 118 (H) 70 - 99 mg/dL    Comment: Glucose reference range applies only to samples taken after fasting for at least 8 hours.   Calcium, Ion 1.15 1.15 - 1.40 mmol/L   TCO2 24 22 - 32 mmol/L   Hemoglobin 14.3 12.0 - 15.0 g/dL   HCT 42.0 36.0 - 46.0 %  Urine rapid drug screen (hosp performed)     Status: None   Collection Time: 02/18/23  3:46 PM  Result Value Ref Range   Opiates NONE DETECTED NONE DETECTED   Cocaine NONE DETECTED NONE DETECTED   Benzodiazepines NONE DETECTED NONE DETECTED   Amphetamines NONE DETECTED NONE DETECTED   Tetrahydrocannabinol NONE DETECTED NONE DETECTED   Barbiturates NONE DETECTED  NONE DETECTED    Comment: (NOTE) DRUG SCREEN  FOR MEDICAL PURPOSES ONLY.  IF CONFIRMATION IS NEEDED FOR ANY PURPOSE, NOTIFY LAB WITHIN 5 DAYS.  LOWEST DETECTABLE LIMITS FOR URINE DRUG SCREEN Drug Class                     Cutoff (ng/mL) Amphetamine and metabolites    1000 Barbiturate and metabolites    200 Benzodiazepine                 200 Opiates and metabolites        300 Cocaine and metabolites        300 THC                            50 Performed at [facility], [place] [address] [place], [place] [address]   Urinalysis, Routine w reflex microscopic -Urine, Clean Catch     Status: Abnormal   Collection Time: 02/18/23  3:46 PM  Result Value Ref Range   Color, Urine YELLOW YELLOW   APPearance HAZY (A) CLEAR   Specific Gravity, Urine 1.008 1.005 - 1.030   pH 6.0 5.0 - 8.0   Glucose, UA NEGATIVE NEGATIVE mg/dL   Hgb urine dipstick NEGATIVE NEGATIVE   Bilirubin Urine NEGATIVE NEGATIVE   Ketones, ur NEGATIVE NEGATIVE mg/dL   Protein, ur NEGATIVE NEGATIVE mg/dL   Nitrite NEGATIVE NEGATIVE   Leukocytes,Ua LARGE (A) NEGATIVE   RBC / HPF 0-5 0 - 5 RBC/hpf   WBC, UA >50 0 - 5 WBC/hpf   Bacteria, UA MANY (A) NONE SEEN   Squamous Epithelial / HPF 0-5 0 - 5 /HPF    Comment: Performed at [facility], [address], [place], [place] [address]  Lipid panel     Status: Abnormal   Collection Time: 02/19/23 12:15 AM  Result Value Ref Range   Cholesterol 204 (H) 0 - 200 mg/dL   Triglycerides 41 <150 mg/dL   HDL 88 >40 mg/dL   Total CHOL/HDL Ratio 2.3 RATIO   VLDL 8 0 - 40 mg/dL   LDL Cholesterol 108 (H) 0 - 99 mg/dL    Comment:        Total Cholesterol/HDL:CHD Risk Coronary Heart Disease Risk Table                     Men   Women  1/2 Average Risk   3.4   3.3  Average Risk       5.0   4.4  2 X Average Risk   9.6   7.1  3 X Average Risk  23.4   11.0        Use the calculated Patient Ratio above and the CHD Risk Table to determine the patient's CHD Risk.        ATP III CLASSIFICATION (LDL):  <100     mg/dL    Optimal  100-129  mg/dL   Near or Above                    Optimal  130-159  mg/dL   Borderline  160-189  mg/dL   High  >190     mg/dL   Very High Performed at [facility] [address], [place], [place] [address]    ECHOCARDIOGRAM COMPLETE  Result Date: 02/19/2023    ECHOCARDIOGRAM REPORT   Patient Name:   [facility]  Ovid Curd Date of Exam: 02/19/2023 Medical Rec #:  [id number]          Height:       70.0 in Accession #:    [id number]         Weight:       138.0 lb Date of Birth:  1951/03/08          BSA:          1.783 m Patient Age:    11 years           BP:           124/81 mmHg Patient Gender: F                  HR:           76 bpm. Exam Location:  Inpatient Procedure: 2D Echo, Cardiac Doppler and Color Doppler Indications:    Stroke I63.9  History:        Patient has no prior history of Echocardiogram examinations.                 Risk Factors:Hypertension.  Sonographer:    Ronny Flurry Referring Phys: [place]  1. Left ventricular ejection fraction, by estimation, is 55 to 60%. The left ventricle has normal function. The left ventricle has no regional wall motion abnormalities. Left ventricular diastolic parameters are consistent with Grade II diastolic dysfunction (pseudonormalization).  2. Right ventricular systolic function is normal. The right ventricular size is normal.  3. Left atrial size was mild to moderately dilated.  4. Right atrial size was mildly dilated.  5. The mitral valve is normal in structure. Trivial mitral valve regurgitation. No evidence of mitral stenosis.  6. The aortic valve is normal in structure. Aortic valve regurgitation is mild. No aortic stenosis is present. Aortic valve area, by VTI measures 2.40 cm. Aortic valve mean gradient measures 3.0 mmHg. Aortic valve Vmax measures 1.14 m/s.  7. Aortic dilatation noted. There is borderline dilatation of the aortic root, measuring 37 mm.  8. The inferior vena cava is normal in size with  greater than 50% respiratory variability, suggesting right atrial pressure of 3 mmHg. FINDINGS  Left Ventricle: Left ventricular ejection fraction, by estimation, is 55 to 60%. The left ventricle has normal function. The left ventricle has no regional wall motion abnormalities. The left ventricular internal cavity size was normal in size. There is  no left ventricular hypertrophy. Left ventricular diastolic parameters are consistent with Grade II diastolic dysfunction (pseudonormalization). Right Ventricle: The right ventricular size is normal. No increase in right ventricular wall thickness. Right ventricular systolic function is normal. Left Atrium: Left atrial size was mild to moderately dilated. Right Atrium: Right atrial size was mildly dilated. Pericardium: There is no evidence of pericardial effusion. Mitral Valve: The mitral valve is normal in structure. Trivial mitral valve regurgitation. No evidence of mitral valve stenosis. Tricuspid Valve: The tricuspid valve is normal in structure. Tricuspid valve regurgitation is mild . No evidence of tricuspid stenosis. Aortic Valve: The aortic valve is normal in structure. Aortic valve regurgitation is mild. No aortic stenosis is present. Aortic valve mean gradient measures 3.0 mmHg. Aortic valve peak gradient measures 5.2 mmHg. Aortic valve area, by VTI measures 2.40 cm. Pulmonic Valve: The pulmonic valve was normal in structure. Pulmonic valve regurgitation is trivial. No evidence of pulmonic stenosis. Aorta: Aortic dilatation noted. There is borderline dilatation of the aortic root, measuring 37 mm. Venous: The inferior vena  cava is normal in size with greater than 50% respiratory variability, suggesting right atrial pressure of 3 mmHg. IAS/Shunts: No atrial level shunt detected by color flow Doppler.  LEFT VENTRICLE PLAX 2D LVIDd:         4.50 cm   Diastology LVIDs:         3.10 cm   LV e' medial:    6.85 cm/s LV PW:         1.00 cm   LV E/e' medial:  7.9 LV  IVS:        1.00 cm   LV e' lateral:   10.20 cm/s LVOT diam:     2.10 cm   LV E/e' lateral: 5.3 LV SV:         57 LV SV Index:   32 LVOT Area:     3.46 cm                           3D Volume EF:                          3D EF:        64 %                          LV EDV:       140 ml                          LV ESV:       50 ml                          LV SV:        90 ml RIGHT VENTRICLE             IVC RV S prime:     13.60 cm/s  IVC diam: 1.10 cm TAPSE (M-mode): 2.1 cm LEFT ATRIUM            Index        RIGHT ATRIUM           Index LA diam:      3.30 cm  1.85 cm/m   RA Area:     25.75 cm LA Vol (A2C): 39.9 ml  22.35 ml/m  RA Volume:   82.75 ml  46.42 ml/m LA Vol (A4C): 103.0 ml 57.77 ml/m  AORTIC VALVE AV Area (Vmax):    2.52 cm AV Area (Vmean):   2.44 cm AV Area (VTI):     2.40 cm AV Vmax:           114.50 cm/s AV Vmean:          80.800 cm/s AV VTI:            0.238 m AV Peak Grad:      5.2 mmHg AV Mean Grad:      3.0 mmHg LVOT Vmax:         83.27 cm/s LVOT Vmean:        56.833 cm/s LVOT VTI:          0.165 m LVOT/AV VTI ratio: 0.69  AORTA Ao Root diam: 3.70 cm Ao Asc diam:  3.60 cm MITRAL VALVE               TRICUSPID VALVE MV Area (PHT): 3.72 cm  TR Peak grad:   15.5 mmHg MV Decel Time: 204 msec    TR Vmax:        197.00 cm/s MV E velocity: 53.90 cm/s MV A velocity: 57.40 cm/s  SHUNTS MV E/A ratio:  0.94        Systemic VTI:  0.17 m                            Systemic Diam: 2.10 cm Glori Bickers MD Electronically signed by Glori Bickers MD Signature Date/Time: 02/19/2023/12:11:47 PM    Final    CT HEAD WO CONTRAST  Result Date: 02/18/2023 CLINICAL DATA:  TIA. EXAM: CT HEAD WITHOUT CONTRAST TECHNIQUE: Contiguous axial images were obtained from the base of the skull through the vertex without intravenous contrast. RADIATION DOSE REDUCTION: This exam was performed according to the departmental dose-optimization program which includes automated exposure control, adjustment of the mA and/or kV  according to patient size and/or use of iterative reconstruction technique. COMPARISON:  None Available. FINDINGS: Brain: There is no evidence of an acute infarct, intracranial hemorrhage, mass, midline shift, or extra-axial fluid collection. Mild cerebral atrophy is within normal limits for age. Periventricular white matter hypodensities are nonspecific but compatible with minimal chronic small vessel ischemic disease. Vascular: Calcified atherosclerosis at the skull base. No hyperdense vessel. Skull: No fracture or suspicious osseous lesion. Scattered intra osseous arachnoid granulations are noted, including a prominent one at the parietal vertex. Sinuses/Orbits: Partially visualized mild mucosal thickening in the maxillary sinuses. Clear mastoid air cells. Unremarkable orbits. Other: None. IMPRESSION: No evidence of acute intracranial abnormality. Electronically Signed   By: Logan Bores M.D.   On: 02/18/2023 15:43    Pending Labs Unresulted Labs (From admission, onward)     Start     Ordered   02/18/23 1737  Hemoglobin A1c  (Labs)  Once,   URGENT       Comments: To assess prior glycemic control    02/18/23 1742            Vitals/Pain Today's Vitals   02/19/23 0759 02/19/23 0800 02/19/23 0803 02/19/23 0902  BP:   (!) 161/80   Pulse:  85 82   Resp:  15 16   Temp:    98.3 F (36.8 C)  TempSrc:    Oral  SpO2:  97% 99%   PainSc: 0-No pain       Isolation Precautions No active isolations  Medications Medications  acetaminophen (TYLENOL) tablet 650 mg (has no administration in time range)    Or  acetaminophen (TYLENOL) 160 MG/5ML solution 650 mg (has no administration in time range)    Or  acetaminophen (TYLENOL) suppository 650 mg (has no administration in time range)  senna-docusate (Senokot-S) tablet 1 tablet (has no administration in time range)  enoxaparin (LOVENOX) injection 40 mg (40 mg Subcutaneous Given 02/18/23 2114)  pantoprazole (PROTONIX) EC tablet 40 mg (40 mg Oral  Given 02/19/23 0801)  cephALEXin (KEFLEX) capsule 500 mg (500 mg Oral Given 02/19/23 0955)  aspirin chewable tablet 81 mg (has no administration in time range)  clopidogrel (PLAVIX) tablet 75 mg (has no administration in time range)  rosuvastatin (CRESTOR) tablet 20 mg (has no administration in time range)   stroke: early stages of recovery book (1 each Does not apply Given 02/19/23 0902)    Mobility walks     Focused Assessments Neuro Assessment Handoff:  Swallow screen pass? Yes  Cardiac Rhythm: Normal sinus rhythm  NIH Stroke Scale  Dizziness Present: No Headache Present: No Interval: Shift assessment Level of Consciousness (1a.)   : Alert, keenly responsive LOC Questions (1b. )   : Answers both questions correctly LOC Commands (1c. )   : Performs both tasks correctly Best Gaze (2. )  : Normal Visual (3. )  : No visual loss Facial Palsy (4. )    : Normal symmetrical movements Motor Arm, Left (5a. )   : No drift Motor Arm, Right (5b. ) : No drift Motor Leg, Left (6a. )  : No drift Motor Leg, Right (6b. ) : No drift Limb Ataxia (7. ): Absent Sensory (8. )  : Normal, no sensory loss Best Language (9. )  : No aphasia Dysarthria (10. ): Normal Extinction/Inattention (11.)   : No Abnormality Complete NIHSS TOTAL: 0     Neuro Assessment: Within Defined Limits Neuro Checks:   Initial (02/18/23 1453)  Has TPA been given? No If patient is a Neuro Trauma and patient is going to OR before floor call report to [place] nurse: [phone] or [phone]   R Recommendations: See Admitting Provider Note  Report given to:   Additional Notes:

## 2023-02-19 NOTE — ED Notes (Signed)
Pt ambulatory to rr with steady gait.

## 2023-02-19 NOTE — Progress Notes (Signed)
Echocardiogram 2D Echocardiogram has been performed.  Ronny Flurry 02/19/2023, 11:55 AM

## 2023-02-19 NOTE — ED Notes (Signed)
Pt was given wash cloths and towel to freshen up a bit , Pt was disconnected from monitor to use restroom and wash up

## 2023-02-19 NOTE — Progress Notes (Signed)
PT Cancellation Note  Patient Details Name: Cheryl Gallegos MRN: [id number] DOB: 11/17/1951   Cancelled Treatment:    Reason Eval/Treat Not Completed: PT screened, no needs identified, will sign off. Pt reports being back to baseline, ambulating independently, denies mobility concerns at this time and declines PT evaluation. No PT needs at this time.   Zenaida Niece 02/19/2023, 9:53 AM

## 2023-02-19 NOTE — Evaluation (Signed)
Occupational Therapy Evaluation Patient Details Name: Cheryl Gallegos MRN: [id number] DOB: 1951/07/20 Today's Date: 02/19/2023   History of Present Illness 72 y.o. female presents to [facility] hospital on 02/18/2023 after a period of expressive aphasia and gait instability. CT negative. PMH includes HTN.   Clinical Impression   [place] was evaluated s/p the above admission list. She is indep and working at baseline. Upon evaluation she reported that her communication and cognitive impairment have resolved and she feels back to baseline. Overall she demonstrated independent ability to complete ADLs and mobility. Pt does not have further acute OT needs, will sign off. Recommend d/c to home with support from family as needed.       Recommendations for follow up therapy are one component of a multi-disciplinary discharge planning process, led by the attending physician.  Recommendations may be updated based on patient status, additional functional criteria and insurance authorization.   Follow Up Recommendations  No OT follow up     Assistance Recommended at Discharge None  Patient can return home with the following Other (comment) (back to indep)    Functional Status Assessment  Patient has had a recent decline in their functional status and demonstrates the ability to make significant improvements in function in a reasonable and predictable amount of time.  Equipment Recommendations  None recommended by OT       Precautions / Restrictions Precautions Precautions: Fall Restrictions Weight Bearing Restrictions: No      Mobility Bed Mobility Overal bed mobility: Independent                  Transfers Overall transfer level: Independent          Balance Overall balance assessment: No apparent balance deficits (not formally assessed)         ADL either performed or assessed with clinical judgement   ADL Overall ADL's : Needs assistance/impaired              General ADL Comments: Pt demonstrated indep ability to complete ADLs     Vision Baseline Vision/History: 1 Wears glasses Vision Assessment?: No apparent visual deficits     Perception Perception Perception Tested?: No   Praxis Praxis Praxis tested?: Not tested    Pertinent Vitals/Pain Pain Assessment Pain Assessment: No/denies pain     Hand Dominance Right   Extremity/Trunk Assessment Upper Extremity Assessment Upper Extremity Assessment: Overall WFL for tasks assessed;RUE deficits/detail RUE Deficits / Details: hx of LUE impairments due to trauma that occured 20 years ago RUE Sensation: WNL RUE Coordination: decreased gross motor   Lower Extremity Assessment Lower Extremity Assessment: Overall WFL for tasks assessed   Cervical / Trunk Assessment Cervical / Trunk Assessment: Normal   Communication Communication Communication: No difficulties (back to baseline)   Cognition Arousal/Alertness: Awake/alert Behavior During Therapy: WFL for tasks assessed/performed Overall Cognitive Status: Within Functional Limits for tasks assessed       General Comments: Pt reports initially she was having slowed processing and word finding difficulties; all symptoms have resolved. back to cognitive baseline     General Comments  BP elevated but stable     Home Living Family/patient expects to be discharged to:: Private residence Living Arrangements: Spouse/significant other Available Help at Discharge: Family;Available 24 hours/day Type of Home: House Home Access: Stairs to enter [employer] of Steps: 8 or 5 Entrance Stairs-Rails: Left;Right Home Layout: Two level;Bed/bath upstairs Alternate Level Stairs-Number of Steps: flight Alternate Level Stairs-Rails: Right;Left Bathroom Shower/Tub: Occupational psychologist:  Standard     Home Equipment: None          Prior Functioning/Environment Prior Level of Function : Independent/Modified  Independent;Working/employed;Driving             Mobility Comments: no AD, goes to the gym regularly ADLs Comments: Indep, works        OT Problem List: Decreased cognition (resolved)         OT Goals(Current goals can be found in the care plan section) Acute Rehab OT Goals Patient Stated Goal: home OT Goal Formulation: With patient Time For Goal Achievement: 03/05/23 Potential to Achieve Goals: Good   AM-PAC OT "6 Clicks" Daily Activity     Outcome Measure Help from another person eating meals?: None Help from another person taking care of personal grooming?: None Help from another person toileting, which includes using toliet, bedpan, or urinal?: None Help from another person bathing (including washing, rinsing, drying)?: None Help from another person to put on and taking off regular upper body clothing?: None Help from another person to put on and taking off regular lower body clothing?: None 6 Click Score: 24   End of Session Nurse Communication: Mobility status  Activity Tolerance: Patient tolerated treatment well Patient left: in bed;with call bell/phone within reach  OT Visit Diagnosis: Cognitive communication deficit (R41.841) Symptoms and signs involving cognitive functions: Other cerebrovascular disease (TIA)                Time: [id number] OT Time Calculation (min): 24 min Charges:  OT General Charges $OT Visit: 1 Visit OT Evaluation $OT Eval Moderate Complexity: 1 Mod OT Treatments $Self Care/Home Management : 8-22 mins  Shade Flood, OTR/L Acute Rehabilitation Services Office [phone] Secure Chat Communication Preferred   Elliot Cousin 02/19/2023, 9:21 AM

## 2023-02-26 ENCOUNTER — Telehealth: Payer: Self-pay | Admitting: *Deleted

## 2023-02-26 ENCOUNTER — Ambulatory Visit: Payer: BC Managed Care – PPO | Attending: Internal Medicine

## 2023-02-26 DIAGNOSIS — G459 Transient cerebral ischemic attack, unspecified: Secondary | ICD-10-CM

## 2023-02-26 DIAGNOSIS — I480 Paroxysmal atrial fibrillation: Secondary | ICD-10-CM

## 2023-02-26 DIAGNOSIS — I491 Atrial premature depolarization: Secondary | ICD-10-CM | POA: Diagnosis not present

## 2023-02-26 NOTE — Telephone Encounter (Signed)
-----   Message from April Henson sent at 02/23/2023  9:06 AM EDT ----- Contact: [phone] Patient is requesting call back in regards to monitor that was to be ordered and delivery.

## 2023-02-26 NOTE — Telephone Encounter (Signed)
According to [employer] tracking, her monitor should be delivered today before 7PM.  Patient provided tracking # [id number].

## 2023-03-05 ENCOUNTER — Other Ambulatory Visit: Payer: Self-pay | Admitting: Family Medicine

## 2023-03-05 DIAGNOSIS — K118 Other diseases of salivary glands: Secondary | ICD-10-CM

## 2023-03-23 ENCOUNTER — Encounter: Payer: Self-pay | Admitting: Neurology

## 2023-03-23 ENCOUNTER — Ambulatory Visit: Payer: BC Managed Care – PPO | Admitting: Neurology

## 2023-03-23 VITALS — BP 147/74 | HR 88 | Ht 70.0 in | Wt 137.0 lb

## 2023-03-23 DIAGNOSIS — G459 Transient cerebral ischemic attack, unspecified: Secondary | ICD-10-CM | POA: Diagnosis not present

## 2023-03-23 DIAGNOSIS — R4701 Aphasia: Secondary | ICD-10-CM

## 2023-03-23 NOTE — Progress Notes (Signed)
[facility]  PATIENT: Cheryl Gallegos DOB: November 15, 1951  REQUESTING CLINICIAN: de Saintclair Halsted, Cortney E,* HISTORY FROM: Patient/Chart review  REASON FOR VISIT: TIA, aphasia    HISTORICAL  CHIEF COMPLAINT:  Chief Complaint  Patient presents with   New Patient (Initial Visit)    Patient room #12 and alone. Patient states she here today to f/u with her poss stroke.     HISTORY OF PRESENT ILLNESS:  This is a 72 year old woman with past medical history of Hypertension, Hyperlipidemia, who is presenting after an episode of aphasia lasting about 3 min, inability to produce speech, followed a slurred speech and feeling globally overwhelmed. Patient stated the episode started while doing her grade (Grading papers), she went down to get the attention of her husband and was able to say "GET HERE NOW". By the time they reached the ED, symptoms resolved. Her CT CTA was negative, and she was unable to get a MRI Brain due to shrapnel in her body. She was diagnosed with TIA, completed a 21 days of DAPT, Aspirin and Plavix and currently on Aspirin alone. She also completed a 21 days cardiac monitor and pending follow up with cardiology next week.  She never experienced an similar episode in the past and since then, she has been doing good.   OTHER MEDICAL CONDITIONS: Hypertension, hyperlipidemia    REVIEW OF SYSTEMS: Full 14 system review of systems performed and negative with exception of: As noted in the HPI   ALLERGIES: Allergies  Allergen Reactions   Beta Adrenergic Blockers Other (See Comments)    Orthostatic hypotension   Ibuprofen Swelling   Tomato Hives   Chocolate Swelling and Rash    HOME MEDICATIONS: Outpatient Medications Prior to Visit  Medication Sig Dispense Refill   alendronate (FOSAMAX) 70 MG tablet Take 70 mg by mouth every Thursday.     amLODipine (NORVASC) 5 MG tablet Take 5 mg by mouth daily.     aspirin EC 81 MG tablet Take 1 tablet (81 mg total)  by mouth daily. Swallow whole. 150 tablet 2   lisinopril (ZESTRIL) 20 MG tablet Take 1 tablet (20 mg total) by mouth daily.     minocycline (MINOCIN) 50 MG capsule Take 50 mg by mouth 2 (two) times daily as needed (for rash/hives).     rosuvastatin (CRESTOR) 20 MG tablet Take 1 tablet (20 mg total) by mouth daily. 60 tablet 0   No facility-administered medications prior to visit.    PAST MEDICAL HISTORY: Past Medical History:  Diagnosis Date   Hypertension     PAST SURGICAL HISTORY: Past Surgical History:  Procedure Laterality Date   ABDOMINAL HYSTERECTOMY     APPENDECTOMY     BREAST EXCISIONAL BIOPSY Left 1975   benign    FAMILY HISTORY: Family History  Problem Relation Age of Onset   Bone cancer Mother    Aneurysm Father    Hypertension Brother     SOCIAL HISTORY: Social History   Socioeconomic History   Marital status: Married    Spouse name: Not on file   Number of children: Not on file   Years of education: Not on file   Highest education level: Not on file  Occupational History   Not on file  Tobacco Use   Smoking status: Unknown   Smokeless tobacco: Not on file  Substance and Sexual Activity   Alcohol use: Yes    Comment: occ   Drug use: Never   Sexual activity: Not  on file  Other Topics Concern   Not on file  Social History Narrative   Not on file   Social Determinants of Health   Financial Resource Strain: Not on file  Food Insecurity: Not on file  Transportation Needs: Not on file  Physical Activity: Not on file  Stress: Not on file  Social Connections: Not on file  Intimate Partner Violence: Not on file     PHYSICAL EXAM   GENERAL EXAM/CONSTITUTIONAL: Vitals:  Vitals:   03/23/23 0817  BP: (!) 147/74  Pulse: 88  Weight: 147 lb (66.7 kg)  Height:  (1.778 m)   Body mass index is 21.09 kg/m. Wt Readings from Last 3 Encounters:  03/23/23 147 lb (66.7 kg)  09/25/22 138 lb (62.6 kg)   Patient is in no distress; well  developed, nourished and groomed; neck is supple  EYES: Visual fields full to confrontation, Extraocular movements intacts,   MUSCULOSKELETAL: Gait, strength, tone, movements noted in Neurologic exam below  NEUROLOGIC: MENTAL STATUS:      No data to display         awake, alert, oriented to person, place and time recent and remote memory intact normal attention and concentration language fluent, comprehension intact, naming intact. Able to name, repeat and comprehend fund of knowledge appropriate  CRANIAL NERVE:  2nd, 3rd, 4th, 6th - Visual fields full to confrontation, extraocular muscles intact, no nystagmus 5th - facial sensation symmetric 7th - facial strength symmetric 8th - hearing intact 9th - palate elevates symmetrically, uvula midline 11th - shoulder shrug symmetric 12th - tongue protrusion midline  MOTOR:  normal bulk and tone, full strength in the BUE, BLE  SENSORY:  normal and symmetric to light touch  COORDINATION:  finger-nose-finger, fine finger movements normal  GAIT/STATION:  normal    DIAGNOSTIC DATA (LABS, IMAGING, TESTING) - I reviewed patient records, labs, notes, testing and imaging myself where available.  Lab Results  Component Value Date   WBC 8.7 02/18/2023   HGB 14.3 02/18/2023   HCT 42.0 02/18/2023   MCV 94.4 02/18/2023   PLT 264 02/18/2023      Component Value Date/Time   NA 138 02/18/2023 1521   K 4.0 02/18/2023 1521   CL 103 02/18/2023 1521   CO2 22 02/18/2023 1455   GLUCOSE 118 (H) 02/18/2023 1521   BUN 18 02/18/2023 1521   CREATININE 0.90 02/18/2023 1521   CALCIUM 9.2 02/18/2023 1455   PROT 6.5 02/18/2023 1455   ALBUMIN 4.2 02/18/2023 1455   AST 21 02/18/2023 1455   ALT 15 02/18/2023 1455   ALKPHOS 51 02/18/2023 1455   BILITOT 0.6 02/18/2023 1455   GFRNONAA >60 02/18/2023 1455   Lab Results  Component Value Date   CHOL 204 (H) 02/19/2023   HDL 88 02/19/2023   LDLCALC 108 (H) 02/19/2023   TRIG 41  02/19/2023   CHOLHDL 2.3 02/19/2023   Lab Results  Component Value Date   HGBA1C 5.5 02/19/2023   No results found for: "VITAMINB12" No results found for: "TSH"  CT Head 02/17/2022 No evidence of acute intracranial abnormality   CTA neck: 1. The common carotid, internal carotid and vertebral arteries are patent within the neck without stenosis. Mild atherosclerotic plaque within the proximal left ICA. 2.  Aortic Atherosclerosis (ICD10-I70.0). 3. Indeterminate 2.1 x 1.4 cm lesion within the right parotid gland. A targeted ultrasound is recommended for further evaluation.   CTA head: 1. No intracranial large vessel occlusion or proximal high-grade arterial stenosis identified.  2. Atherosclerotic plaque within the intracranial internal carotid arteries with no more than mild stenosis.     ASSESSMENT AND PLAN  72 y.o. year old female with hypertension, hyperlipidemia who is presenting after a 3 minutes episode of aphasia, followed by slurred and feeling overwhelmed. Her CT head, CTA unrevealing, could not obtain a MRI due to shrapnel. She already completed DAPT and is on Aspirin alone. Plan will be for patient to follow up with Cardiology regarding the cardiac monitor, if any evidence of Afib, she will likely be anticoagulated. We will also obtain an EEG to rule out seizures. I will contact her to go over the results, otherwise follow up in a year or sooner if worse. She voiced understanding.    1. TIA (transient ischemic attack)   2. Aphasia      Patient Instructions  Follow up with cardiology  Routine EEG  Continue current medications  Return in a year or sooner if worse    Orders Placed This Encounter  Procedures   EEG adult    No orders of the defined types were placed in this encounter.   Return in about 1 year (around 03/22/2024).    Windell Norfolk, MD 03/23/2023, 11:34 AM  [facility] [address] [place], [place] [address] [phone]

## 2023-03-23 NOTE — Patient Instructions (Signed)
Follow up with cardiology  Routine EEG  Continue current medications  Return in a year or sooner if worse

## 2023-03-27 NOTE — Progress Notes (Signed)
Cardiology Office Note:   Date:  03/29/2023  ID:  Cheryl Gallegos, DOB 14-Jul-1951, MRN [id number]  History of Present Illness:   Cheryl Gallegos is a 72 y.o. female with history of HTN, HLD and TIA who was referred by Dr. Chanetta Marshall for further evaluation of TIA.  Per review of the record, the patient suffered an episode of slurred speech prompting her to go to the ER. Her CTA was negative and MRI was unable to be obtained due to shrapnel in her body. She was diagnosed with a TIA and completed a 21 day course of DAPT. She was also recommended for a cardiac monitor.  Today, the patient overall feels well. She is back to baseline with no further speech issues. No chest pain, SOB, orthopnea, PND, or LE edema. No palpitations, lightheadedness, or dizziness. Remains active without exertional issues.   Past Medical History:  Diagnosis Date   Hypertension      ROS: As per HPI  Studies Reviewed:    EKG:  02/18/23: NSR, PACs  Cardiac Studies & Procedures       ECHOCARDIOGRAM  ECHOCARDIOGRAM COMPLETE 02/19/2023  Narrative ECHOCARDIOGRAM REPORT    Patient Name:   Cheryl Gallegos Date of Exam: 02/19/2023 Medical Rec #:  [id number]          Height:       70.0 in Accession #:    [id number]         Weight:       138.0 lb Date of Birth:  11-Nov-1951          BSA:          1.783 m Patient Age:    71 years           BP:           124/81 mmHg Patient Gender: F                  HR:           76 bpm. Exam Location:  Inpatient  Procedure: 2D Echo, Cardiac Doppler and Color Doppler  Indications:    Stroke I63.9  History:        Patient has no prior history of Echocardiogram examinations. Risk Factors:Hypertension.  Sonographer:    Lucendia Herrlich Referring Phys: [phone] DENISE A WOLFE  IMPRESSIONS   1. Left ventricular ejection fraction, by estimation, is 55 to 60%. The left ventricle has normal function. The left ventricle has no regional wall motion abnormalities. Left  ventricular diastolic parameters are consistent with Grade II diastolic dysfunction (pseudonormalization). 2. Right ventricular systolic function is normal. The right ventricular size is normal. 3. Left atrial size was mild to moderately dilated. 4. Right atrial size was mildly dilated. 5. The mitral valve is normal in structure. Trivial mitral valve regurgitation. No evidence of mitral stenosis. 6. The aortic valve is normal in structure. Aortic valve regurgitation is mild. No aortic stenosis is present. Aortic valve area, by VTI measures 2.40 cm. Aortic valve mean gradient measures 3.0 mmHg. Aortic valve Vmax measures 1.14 m/s. 7. Aortic dilatation noted. There is borderline dilatation of the aortic root, measuring 37 mm. 8. The inferior vena cava is normal in size with greater than 50% respiratory variability, suggesting right atrial pressure of 3 mmHg.  FINDINGS Left Ventricle: Left ventricular ejection fraction, by estimation, is 55 to 60%. The left ventricle has normal function. The left ventricle has no regional wall motion abnormalities. The left ventricular internal cavity size was  normal in size. There is no left ventricular hypertrophy. Left ventricular diastolic parameters are consistent with Grade II diastolic dysfunction (pseudonormalization).  Right Ventricle: The right ventricular size is normal. No increase in right ventricular wall thickness. Right ventricular systolic function is normal.  Left Atrium: Left atrial size was mild to moderately dilated.  Right Atrium: Right atrial size was mildly dilated.  Pericardium: There is no evidence of pericardial effusion.  Mitral Valve: The mitral valve is normal in structure. Trivial mitral valve regurgitation. No evidence of mitral valve stenosis.  Tricuspid Valve: The tricuspid valve is normal in structure. Tricuspid valve regurgitation is mild . No evidence of tricuspid stenosis.  Aortic Valve: The aortic valve is normal in  structure. Aortic valve regurgitation is mild. No aortic stenosis is present. Aortic valve mean gradient measures 3.0 mmHg. Aortic valve peak gradient measures 5.2 mmHg. Aortic valve area, by VTI measures 2.40 cm.  Pulmonic Valve: The pulmonic valve was normal in structure. Pulmonic valve regurgitation is trivial. No evidence of pulmonic stenosis.  Aorta: Aortic dilatation noted. There is borderline dilatation of the aortic root, measuring 37 mm.  Venous: The inferior vena cava is normal in size with greater than 50% respiratory variability, suggesting right atrial pressure of 3 mmHg.  IAS/Shunts: No atrial level shunt detected by color flow Doppler.   LEFT VENTRICLE PLAX 2D LVIDd:         4.50 cm   Diastology LVIDs:         3.10 cm   LV e' medial:    6.85 cm/s LV PW:         1.00 cm   LV E/e' medial:  7.9 LV IVS:        1.00 cm   LV e' lateral:   10.20 cm/s LVOT diam:     2.10 cm   LV E/e' lateral: 5.3 LV SV:         57 LV SV Index:   32 LVOT Area:     3.46 cm  3D Volume EF: 3D EF:        64 % LV EDV:       140 ml LV ESV:       50 ml LV SV:        90 ml  RIGHT VENTRICLE             IVC RV S prime:     13.60 cm/s  IVC diam: 1.10 cm TAPSE (M-mode): 2.1 cm  LEFT ATRIUM            Index        RIGHT ATRIUM           Index LA diam:      3.30 cm  1.85 cm/m   RA Area:     25.75 cm LA Vol (A2C): 39.9 ml  22.35 ml/m  RA Volume:   82.75 ml  46.42 ml/m LA Vol (A4C): 103.0 ml 57.77 ml/m AORTIC VALVE AV Area (Vmax):    2.52 cm AV Area (Vmean):   2.44 cm AV Area (VTI):     2.40 cm AV Vmax:           114.50 cm/s AV Vmean:          80.800 cm/s AV VTI:            0.238 m AV Peak Grad:      5.2 mmHg AV Mean Grad:      3.0 mmHg LVOT Vmax:  83.27 cm/s LVOT Vmean:        56.833 cm/s LVOT VTI:          0.165 m LVOT/AV VTI ratio: 0.69  AORTA Ao Root diam: 3.70 cm Ao Asc diam:  3.60 cm  MITRAL VALVE               TRICUSPID VALVE MV Area (PHT): 3.72 cm    TR Peak  grad:   15.5 mmHg MV Decel Time: 204 msec    TR Vmax:        197.00 cm/s MV E velocity: 53.90 cm/s MV A velocity: 57.40 cm/s  SHUNTS MV E/A ratio:  0.94        Systemic VTI:  0.17 m Systemic Diam: 2.10 cm  Arvilla Meres MD Electronically signed by Arvilla Meres MD Signature Date/Time: 02/19/2023/12:11:47 PM    Final    MONITORS  CARDIAC EVENT MONITOR 03/28/2023  Narrative   Patient's monitoring period was from 02/26/23-03/23/23   Predominant rhythm was NSR with average HR 83bpm   No Afib or sustained arrhythmias   Occasional VE (1%), frequent SVE (7%)   2 runs of nonsustained VT with longest lasting 5 beats  Laurance Flatten, MD            Risk Assessment/Calculations:              Physical Exam:   VS:  BP 132/74   Pulse 90   Ht 5\' 10"  (1.778 m)   Wt 138 lb (62.6 kg)   SpO2 96%   BMI 19.80 kg/m    Wt Readings from Last 3 Encounters:  03/29/23 138 lb (62.6 kg)  03/23/23 137 lb (62.1 kg)  09/25/22 138 lb (62.6 kg)     GEN: Well nourished, well developed in no acute distress NECK: No JVD; No carotid bruits CARDIAC: RRR, no murmurs, rubs, gallops RESPIRATORY:  Clear to auscultation without rales, wheezing or rhonchi  ABDOMEN: Soft, non-tender, non-distended EXTREMITIES:  No edema; No deformity   ASSESSMENT AND PLAN:   #TIA: -Patient had a transient episode of slurred speech that resolved upon arrival to the ER -CT head without acute pathology and MRI unable to be obtained due to shrapnel; she was diagnosed with a TIA but definitive diagnosis is not known -TTE with LVEF 55-60%, G2DD, no significant valve disease, mild to moderate LAE, mild RAE -30 day cardiac monitor without Afib but 7% burden PACs (asymptomatic) -Patient very interested in pursuing loop monitoring for further monitoring for possible Afib; will refer to Dr. Elberta Fortis -Continue ASA 81mg  daily -Continue crestor 20mg  daily  #HTN: -Continue amlodipine 5mg  daily -Continue lisinopril 20mg   daily  #HLD: -Continue crestor 20mg  daily -Repeat lipids per PCP  #Mild AR: -Continue serial monitoring with repeat echoes every 3-5 years        Signed, Meriam Sprague, MD

## 2023-03-28 ENCOUNTER — Ambulatory Visit: Payer: BC Managed Care – PPO | Admitting: Internal Medicine

## 2023-03-29 ENCOUNTER — Ambulatory Visit: Payer: BC Managed Care – PPO | Attending: Internal Medicine | Admitting: Cardiology

## 2023-03-29 ENCOUNTER — Encounter: Payer: Self-pay | Admitting: Cardiology

## 2023-03-29 VITALS — BP 132/74 | HR 90 | Ht 70.0 in | Wt 138.0 lb

## 2023-03-29 DIAGNOSIS — I351 Nonrheumatic aortic (valve) insufficiency: Secondary | ICD-10-CM

## 2023-03-29 DIAGNOSIS — I10 Essential (primary) hypertension: Secondary | ICD-10-CM

## 2023-03-29 DIAGNOSIS — E7800 Pure hypercholesterolemia, unspecified: Secondary | ICD-10-CM | POA: Diagnosis not present

## 2023-03-29 DIAGNOSIS — I491 Atrial premature depolarization: Secondary | ICD-10-CM | POA: Diagnosis not present

## 2023-03-29 DIAGNOSIS — G459 Transient cerebral ischemic attack, unspecified: Secondary | ICD-10-CM

## 2023-03-29 NOTE — Patient Instructions (Signed)
Medication Instructions:   *If you need a refill on your cardiac medications before your next appointment, please call your pharmacy*   Lab Work:  If you have labs (blood work) drawn today and your tests are completely normal, you will receive your results only by: MyChart Message (if you have MyChart) OR A paper copy in the mail If you have any lab test that is abnormal or we need to change your treatment, we will call you to review the results.   Testing/Procedures:    Follow-Up: At [facility], you and your health needs are our priority.  As part of our continuing mission to provide you with exceptional heart care, we have created designated Provider Care Teams.  These Care Teams include your primary Cardiologist (physician) and Advanced Practice Providers (APPs -  Physician Assistants and Nurse Practitioners) who all work together to provide you with the care you need, when you need it.  We recommend signing up for the patient portal called "MyChart".  Sign up information is provided on this After Visit Summary.  MyChart is used to connect with patients for Virtual Visits (Telemedicine).  Patients are able to view lab/test results, encounter notes, upcoming appointments, etc.  Non-urgent messages can be sent to your provider as well.   To learn more about what you can do with MyChart, go to [email or web address].    Your next appointment:   1 year(s)  Dr Laurance Flatten    Other Instructions Referral to Dr Gerre Pebbles for considering a Loop implant

## 2023-03-30 NOTE — Progress Notes (Signed)
Kindly inform the patient that heart monitoring study did not show evidence of atrial fibrillation or any worrisome arrhythmias

## 2023-04-02 ENCOUNTER — Ambulatory Visit
Admission: RE | Admit: 2023-04-02 | Discharge: 2023-04-02 | Disposition: A | Discharge status: Home / Self Care | Payer: BC Managed Care – PPO | Source: Ambulatory Visit | Attending: Family Medicine | Admitting: Family Medicine

## 2023-04-02 DIAGNOSIS — K118 Other diseases of salivary glands: Secondary | ICD-10-CM

## 2023-04-05 ENCOUNTER — Ambulatory Visit (INDEPENDENT_AMBULATORY_CARE_PROVIDER_SITE_OTHER): Payer: BC Managed Care – PPO | Admitting: Neurology

## 2023-04-05 DIAGNOSIS — R4701 Aphasia: Secondary | ICD-10-CM

## 2023-04-05 DIAGNOSIS — G459 Transient cerebral ischemic attack, unspecified: Secondary | ICD-10-CM

## 2023-04-05 NOTE — Procedures (Signed)
    History:  72 year old woman with aphasia.  EEG classification:  Awake and asleep  Description of the recording: The background rhythms of this recording consists of a fairly well modulated medium amplitude background activity of 10-11 Hz. As the record progresses, the patient initially is in the waking state, but appears to enter the early stage II sleep during the recording, with rudimentary sleep spindles and vertex sharp wave activity seen. During the wakeful state, photic stimulation is performed, and no abnormal responses were seen. Hyperventilation was also performed, no abnormal response seen. No epileptiform discharges seen during this recording. There was no focal slowing.   Abnormality: None   Impression: This is a normal EEG recording in the waking and sleeping state. No evidence of interictal epileptiform discharges. Normal EEGs, however, do not rule out epilepsy.    Windell Norfolk, MD [facility]

## 2023-04-24 DIAGNOSIS — S67195A Crushing injury of left ring finger, initial encounter: Secondary | ICD-10-CM | POA: Insufficient documentation

## 2023-04-27 ENCOUNTER — Telehealth: Payer: Self-pay | Admitting: Cardiology

## 2023-04-27 NOTE — Telephone Encounter (Signed)
Pt aware nothing to "prepare" for. Informed of possible ILR implant, and aware she will not be able to shower for 3 days if we implant that day. She verbalized understanding and agreeable to plan.

## 2023-04-27 NOTE — Telephone Encounter (Signed)
Patient stated she has a consult with Dr. Elberta Fortis on 5/22 and wants to know what she needs to do to prepare for this visit.

## 2023-05-02 ENCOUNTER — Ambulatory Visit: Payer: BC Managed Care – PPO | Attending: Cardiology | Admitting: Cardiology

## 2023-05-02 ENCOUNTER — Encounter: Payer: Self-pay | Admitting: Cardiology

## 2023-05-02 VITALS — BP 140/80 | HR 74 | Ht 70.0 in | Wt 138.0 lb

## 2023-05-02 DIAGNOSIS — G459 Transient cerebral ischemic attack, unspecified: Secondary | ICD-10-CM

## 2023-05-02 NOTE — Patient Instructions (Signed)
Medication Instructions:  Your physician recommends that you continue on your current medications as directed. Please refer to the Current Medication list given to you today.  Labwork: None ordered.  Testing/Procedures: None ordered.  Follow-Up:  Your physician wants you to follow-up as needed with Dr. Camnitz.      Implantable Loop Recorder Placement, Care After This sheet gives you information about how to care for yourself after your procedure. Your health care provider may also give you more specific instructions. If you have problems or questions, contact your health care provider. What can I expect after the procedure? After the procedure, it is common to have: Soreness or discomfort near the incision. Some swelling or bruising near the incision.  Follow these instructions at home: Incision care  Monitor your cardiac device site for redness, swelling, and drainage. Call the device clinic at [phone] if you experience these symptoms or fever/chills.  Keep the large square bandage on your site for 24 hours and then you may remove it yourself. Keep the steri-strips underneath in place.   You may shower after 72 hours / 3 days from your procedure with the steri-strips in place. They will usually fall off on their own, or may be removed after 10 days. Pat dry.   Avoid lotions, ointments, or perfumes over your incision until it is well-healed.  Please do not submerge in water until your site is completely healed.   Your device is MRI compatible.   Remote monitoring is used to monitor your cardiac device from home. This monitoring is scheduled every month by our office. It allows us to keep an eye on the function of your device to ensure it is working properly.  If your wound site starts to bleed apply pressure.    For help with the monitor please call [employer] Monitor Support Specialist directly at [phone].    If you have any questions/concerns please call the  device clinic at [phone].  Activity  Return to your normal activities.  General instructions Follow instructions from your health care provider about how to manage your implantable loop recorder and transmit the information. Learn how to activate a recording if this is necessary for your type of device. You may go through a metal detection gate, and you may let someone hold a metal detector over your chest. Show your ID card if needed. Do not have an MRI unless you check with your health care provider first. Take over-the-counter and prescription medicines only as told by your health care provider. Keep all follow-up visits as told by your health care provider. This is important. Contact a health care provider if: You have redness, swelling, or pain around your incision. You have a fever. You have pain that is not relieved by your pain medicine. You have triggered your device because of fainting (syncope) or because of a heartbeat that feels like it is racing, slow, fluttering, or skipping (palpitations). Get help right away if you have: Chest pain. Difficulty breathing. Summary After the procedure, it is common to have soreness or discomfort near the incision. Change your dressing as told by your health care provider. Follow instructions from your health care provider about how to manage your implantable loop recorder and transmit the information. Keep all follow-up visits as told by your health care provider. This is important. This information is not intended to replace advice given to you by your health care provider. Make sure you discuss any questions you have with your health care provider. Document   Released: 11/08/2015 Document Revised: 01/12/2018 Document Reviewed: 01/12/2018 Elsevier Patient Education  2020 [employer]   

## 2023-05-02 NOTE — Progress Notes (Signed)
Electrophysiology Office Note   Date:  05/02/2023   ID:  Cheryl, Gallegos 20-Dec-1950, MRN [id number]  PCP:  Shon Hale, MD  Cardiologist:  Shari Prows Primary Electrophysiologist:  Jazsmine Macari Jorja Loa, MD    Chief Complaint: TIA   History of Present Illness: Cheryl Gallegos is a 72 y.o. female who is being seen today for the evaluation of TIA at the request of Meriam Sprague, MD. Presenting today for electrophysiology evaluation.  She has a history seen for hypertension, hyperlipidemia, TIA.  She had an episode of slurred speech prompting a visit to the emergency room.  CTA and MRI were both negative.  She was diagnosed with a TIA.  She wore a cardiac monitor with no arrhythmias.  Today, she denies symptoms of palpitations, chest pain, shortness of breath, orthopnea, PND, lower extremity edema, claudication, dizziness, presyncope, syncope, bleeding, or neurologic sequela. The patient is tolerating medications without difficulties.    Past Medical History:  Diagnosis Date   Hypertension    Past Surgical History:  Procedure Laterality Date   ABDOMINAL HYSTERECTOMY     APPENDECTOMY     BREAST EXCISIONAL BIOPSY Left 1975   benign     Current Outpatient Medications  Medication Sig Dispense Refill   alendronate (FOSAMAX) 70 MG tablet Take 70 mg by mouth every Thursday.     amLODipine (NORVASC) 5 MG tablet Take 5 mg by mouth daily.     aspirin EC 81 MG tablet Take 1 tablet (81 mg total) by mouth daily. Swallow whole. 150 tablet 2   lisinopril (ZESTRIL) 20 MG tablet Take 1 tablet (20 mg total) by mouth daily.     minocycline (MINOCIN) 50 MG capsule Take 50 mg by mouth 2 (two) times daily as needed (for rash/hives).     ofloxacin (OCUFLOX) 0.3 % ophthalmic solution INSTILL 1 DROP IN LEFT EYE FOUR TIMES A DAY     rosuvastatin (CRESTOR) 20 MG tablet Take 1 tablet (20 mg total) by mouth daily. 60 tablet 0   No current facility-administered medications for  this visit.    Allergies:   Beta adrenergic blockers, Ibuprofen, Tomato, and Chocolate   Social History:  The patient  reports current alcohol use. She reports that she does not use drugs.   Family History:  The patient's family history includes Aneurysm in her father; Bone cancer in her mother; Hypertension in her brother.    ROS:  Please see the history of present illness.   Otherwise, review of systems is positive for none.   All other systems are reviewed and negative.    PHYSICAL EXAM: VS:  BP (!) 140/80   Pulse 74   Ht 5\' 10"  (1.778 m)   Wt 138 lb (62.6 kg)   SpO2 98%   BMI 19.80 kg/m  , BMI Body mass index is 19.8 kg/m. GEN: Well nourished, well developed, in no acute distress  HEENT: normal  Neck: no JVD, carotid bruits, or masses Cardiac: RRR; no murmurs, rubs, or gallops,no edema  Respiratory:  clear to auscultation bilaterally, normal work of breathing GI: soft, nontender, nondistended, + BS MS: no deformity or atrophy  Skin: warm and dry Neuro:  Strength and sensation are intact Psych: euthymic mood, full affect  EKG:  EKG is not ordered today. Personal review of the ekg ordered 02/18/23 shows sinus rhythm  Recent Labs: 02/18/2023: ALT 15; BUN 18; Creatinine, Ser 0.90; Hemoglobin 14.3; Platelets 264; Potassium 4.0; Sodium 138    Lipid Panel  Component Value Date/Time   CHOL 204 (H) 02/19/2023 0015   TRIG 41 02/19/2023 0015   HDL 88 02/19/2023 0015   CHOLHDL 2.3 02/19/2023 0015   VLDL 8 02/19/2023 0015   LDLCALC 108 (H) 02/19/2023 0015     Wt Readings from Last 3 Encounters:  05/02/23 138 lb (62.6 kg)  03/29/23 138 lb (62.6 kg)  03/23/23 137 lb (62.1 kg)      Other studies Reviewed: Additional studies/ records that were reviewed today include: TTE 02/19/23  Review of the above records today demonstrates:   1. Left ventricular ejection fraction, by estimation, is 55 to 60%. The  left ventricle has normal function. The left ventricle has no  regional  wall motion abnormalities. Left ventricular diastolic parameters are  consistent with Grade II diastolic  dysfunction (pseudonormalization).   2. Right ventricular systolic function is normal. The right ventricular  size is normal.   3. Left atrial size was mild to moderately dilated.   4. Right atrial size was mildly dilated.   5. The mitral valve is normal in structure. Trivial mitral valve  regurgitation. No evidence of mitral stenosis.   6. The aortic valve is normal in structure. Aortic valve regurgitation is  mild. No aortic stenosis is present. Aortic valve area, by VTI measures  2.40 cm. Aortic valve mean gradient measures 3.0 mmHg. Aortic valve Vmax  measures 1.14 m/s.   7. Aortic dilatation noted. There is borderline dilatation of the aortic  root, measuring 37 mm.   8. The inferior vena cava is normal in size with greater than 50%  respiratory variability, suggesting right atrial pressure of 3 mmHg.   -Monitor 03/28/2023 personally reviewed   Patient's monitoring period was from 02/26/23-03/23/23   Predominant rhythm was NSR with average HR 83bpm   No Afib or sustained arrhythmias   Occasional VE (1%), frequent SVE (7%)   2 runs of nonsustained VT with longest lasting 5 beats  ASSESSMENT AND PLAN:  1.  TIA: Had a transient episode of slurred speech.  Cardiac monitor and echo without major abnormality.  She would potentially benefit from ILR implant.  Risk and benefits have been discussed.  Risk of bleeding and infection.  She understands the risks and is agreed to the procedure.  2.  Hypertension: well controlled  3.  Hyperlipidemia: Continue Crestor per primary physician    Current medicines are reviewed at length with the patient today.   The patient does not have concerns regarding her medicines.  The following changes were made today:  none  Labs/ tests ordered today include:  No orders of the defined types were placed in this  encounter.    Disposition:   FU with Christina Gintz pending ILR results  Signed, Kiptyn Rafuse Jorja Loa, MD  05/02/2023 11:31 AM     [facility] [address] [place] [address] [phone] (office) [phone] (fax)  SURGEON:  Nickey Canedo Jorja Loa, MD     PREPROCEDURE DIAGNOSIS:  TIA    POSTPROCEDURE DIAGNOSIS: TIA     PROCEDURES:   1. Implantable loop recorder implantation    INTRODUCTION:  TINZLEY KERSHAW presents with a history of TIA The costs of loop recorder monitoring have been discussed with the patient. Appropriate time out was performed prior to the procedure.    DESCRIPTION OF PROCEDURE:  Informed written consent was obtained.  The patient required no sedation for the procedure today.  Mapping over the patient's chest was performed to identify the area  where electrograms were most prominent for ILR recording.  This area was found to be the left parasternal region over the 4th intercostal space. The patients left chest was therefore prepped and draped in the usual sterile fashion. The skin overlying the left parasternal region was infiltrated with lidocaine for local analgesia.  A 0.5-cm incision was made over the left parasternal region over the 3rd intercostal space.  A subcutaneous ILR pocket was fashioned using a combination of sharp and blunt dissection.  A [employer] Reveal LINQ (serial # [id number]) implantable loop recorder was then placed into the pocket  R waves were very prominent and measured 0.70mV.  Steri- Strips and a sterile dressing were then applied.  There were no early apparent complications.     CONCLUSIONS:   1. Successful implantation of a implantable loop recorder for a history of TIA  2. No early apparent complications.   Carine Nordgren Jorja Loa, MD  05/02/2023 11:31 AM

## 2023-06-04 ENCOUNTER — Ambulatory Visit (INDEPENDENT_AMBULATORY_CARE_PROVIDER_SITE_OTHER): Payer: BC Managed Care – PPO

## 2023-06-04 DIAGNOSIS — G459 Transient cerebral ischemic attack, unspecified: Secondary | ICD-10-CM | POA: Diagnosis not present

## 2023-06-04 LAB — CUP PACEART REMOTE DEVICE CHECK
Date Time Interrogation Session: 20240624094514
Implantable Pulse Generator Implant Date: 20240522

## 2023-06-21 NOTE — Progress Notes (Signed)
[facility] Summary Report / Loop Recorder 

## 2023-06-27 ENCOUNTER — Other Ambulatory Visit: Payer: Self-pay | Admitting: Family Medicine

## 2023-06-27 DIAGNOSIS — Z1231 Encounter for screening mammogram for malignant neoplasm of breast: Secondary | ICD-10-CM

## 2023-07-07 LAB — CUP PACEART REMOTE DEVICE CHECK
Date Time Interrogation Session: 20240727094517
Implantable Pulse Generator Implant Date: 20240522

## 2023-07-09 ENCOUNTER — Ambulatory Visit: Payer: BC Managed Care – PPO | Attending: Cardiology

## 2023-07-09 DIAGNOSIS — G459 Transient cerebral ischemic attack, unspecified: Secondary | ICD-10-CM | POA: Diagnosis not present

## 2023-07-19 ENCOUNTER — Telehealth: Payer: Self-pay

## 2023-07-19 NOTE — Telephone Encounter (Signed)
Following alert received from [employer] received for 2 AF episodes detected, longest was 1 hour and 58 minutes, ? OAC, AF burden is 0.9% of the time.  Reviewed with Otilio Saber, PA & Canary Brim, NP who agree and recommend AF Clinic referral.   Pt contacted, advised AF was noted on ILR and recommend AF clinic referral to discuss [facility] therapy. Pt agreeable. Advised pt someone will call from AF clinic for apt. Appreciative of call. Patient was asymptomatic and not aware of any arrhythmia.

## 2023-07-24 ENCOUNTER — Ambulatory Visit
Admission: RE | Admit: 2023-07-24 | Discharge: 2023-07-24 | Disposition: A | Discharge status: Home / Self Care | Payer: Medicare Other | Source: Ambulatory Visit | Attending: Family Medicine | Admitting: Family Medicine

## 2023-07-24 DIAGNOSIS — Z1231 Encounter for screening mammogram for malignant neoplasm of breast: Secondary | ICD-10-CM

## 2023-07-24 NOTE — Progress Notes (Signed)
[facility] Summary Report / Loop Recorder 

## 2023-07-26 ENCOUNTER — Other Ambulatory Visit: Payer: Self-pay | Admitting: Family Medicine

## 2023-07-26 DIAGNOSIS — R928 Other abnormal and inconclusive findings on diagnostic imaging of breast: Secondary | ICD-10-CM

## 2023-08-01 ENCOUNTER — Other Ambulatory Visit: Payer: Self-pay | Admitting: Family Medicine

## 2023-08-01 ENCOUNTER — Ambulatory Visit: Admission: RE | Admit: 2023-08-01 | Payer: BC Managed Care – PPO | Source: Ambulatory Visit

## 2023-08-01 ENCOUNTER — Other Ambulatory Visit: Payer: BC Managed Care – PPO

## 2023-08-01 DIAGNOSIS — R928 Other abnormal and inconclusive findings on diagnostic imaging of breast: Secondary | ICD-10-CM

## 2023-08-02 ENCOUNTER — Ambulatory Visit (HOSPITAL_COMMUNITY)
Admission: RE | Admit: 2023-08-02 | Discharge: 2023-08-02 | Disposition: A | Discharge status: Home / Self Care | Payer: Medicare Other | Source: Ambulatory Visit | Attending: Internal Medicine | Admitting: Internal Medicine

## 2023-08-02 ENCOUNTER — Encounter (HOSPITAL_COMMUNITY): Payer: Self-pay | Admitting: Internal Medicine

## 2023-08-02 VITALS — BP 144/64 | HR 83 | Ht 70.0 in | Wt 137.4 lb

## 2023-08-02 DIAGNOSIS — I10 Essential (primary) hypertension: Secondary | ICD-10-CM | POA: Diagnosis not present

## 2023-08-02 DIAGNOSIS — Z7901 Long term (current) use of anticoagulants: Secondary | ICD-10-CM | POA: Insufficient documentation

## 2023-08-02 DIAGNOSIS — E785 Hyperlipidemia, unspecified: Secondary | ICD-10-CM | POA: Insufficient documentation

## 2023-08-02 DIAGNOSIS — D6869 Other thrombophilia: Secondary | ICD-10-CM | POA: Insufficient documentation

## 2023-08-02 DIAGNOSIS — I480 Paroxysmal atrial fibrillation: Secondary | ICD-10-CM | POA: Diagnosis not present

## 2023-08-02 DIAGNOSIS — Z79899 Other long term (current) drug therapy: Secondary | ICD-10-CM | POA: Diagnosis not present

## 2023-08-02 DIAGNOSIS — Z95818 Presence of other cardiac implants and grafts: Secondary | ICD-10-CM | POA: Insufficient documentation

## 2023-08-02 DIAGNOSIS — I498 Other specified cardiac arrhythmias: Secondary | ICD-10-CM | POA: Insufficient documentation

## 2023-08-02 DIAGNOSIS — Z8673 Personal history of transient ischemic attack (TIA), and cerebral infarction without residual deficits: Secondary | ICD-10-CM | POA: Insufficient documentation

## 2023-08-02 MED ORDER — APIXABAN 5 MG PO TABS: 5.0000 mg | ORAL_TABLET | Freq: Two times a day (BID) | ORAL | 3 refills | Status: DC

## 2023-08-02 NOTE — Patient Instructions (Signed)
Kardia mobile    Stop aspirin   Start Eliquis 5mg  twice a day

## 2023-08-02 NOTE — Progress Notes (Signed)
Primary Care Physician: Shon Hale, MD Primary Cardiologist: None Electrophysiologist: Dr. Elberta Fortis    Referring Physician: Device clinic     Cheryl Gallegos is a 72 y.o. female with a history of TIA s/p ILR placement by Dr. Elberta Fortis on 05/02/23, HTN, HLD, and paroxysmal atrial fibrillation who presents for consultation in the [facility]. Device clinic alert on 8/8 for new two episodes of Afib, longest 1 hr and 58 minutes, burden 0.9%. Patient is on ASA. She has a CHADS2VASC score of 5.  On evaluation today, she is currently in NSR. She did not have any cardiac awareness of her Afib. She wears a Fitbit watch but it does not record ECG. She does not know if she snores and will ask her husband. She has an upcoming breast biopsy next week.   Today, she denies symptoms of palpitations, chest pain, shortness of breath, orthopnea, PND, lower extremity edema, dizziness, presyncope, syncope, snoring, daytime somnolence, bleeding, or neurologic sequela. The patient is tolerating medications without difficulties and is otherwise without complaint today.    she has a BMI of Body mass index is 19.71 kg/m.Marland Kitchen Filed Weights   08/02/23 0937  Weight: 62.3 kg    Current Outpatient Medications  Medication Sig Dispense Refill   alendronate (FOSAMAX) 70 MG tablet Take 70 mg by mouth every Thursday.     amLODipine (NORVASC) 5 MG tablet Take 5 mg by mouth daily.     apixaban (ELIQUIS) 5 MG TABS tablet Take 1 tablet (5 mg total) by mouth 2 (two) times daily. 60 tablet 3   lisinopril (ZESTRIL) 20 MG tablet Take 1 tablet (20 mg total) by mouth daily.     minocycline (MINOCIN) 50 MG capsule Take 50 mg by mouth 2 (two) times daily as needed (for rash/hives).     ofloxacin (OCUFLOX) 0.3 % ophthalmic solution INSTILL 1 DROP IN LEFT EYE FOUR TIMES A DAY     rosuvastatin (CRESTOR) 20 MG tablet Take 1 tablet (20 mg total) by mouth daily. 60 tablet 0   No current  facility-administered medications for this encounter.    Atrial Fibrillation Management history:  Previous antiarrhythmic drugs: None Previous cardioversions: None Previous ablations: None Anticoagulation history: None   ROS- All systems are reviewed and negative except as per the HPI above.  Physical Exam: BP (!) 144/64   Pulse 83   Ht 5\' 10"  (1.778 m)   Wt 62.3 kg   BMI 19.71 kg/m   GEN: Well nourished, well developed in no acute distress NECK: No JVD; No carotid bruits CARDIAC: Regular rate and rhythm, no murmurs, rubs, gallops RESPIRATORY:  Clear to auscultation without rales, wheezing or rhonchi  ABDOMEN: Soft, non-tender, non-distended EXTREMITIES:  No edema; No deformity   EKG today demonstrates  Vent. rate 83 BPM PR interval 156 ms QRS duration 88 ms QT/QTcB 386/453 ms P-R-T axes 84 -2 54 Sinus rhythm with marked sinus arrhythmia Otherwise normal ECG When compared with ECG of 18-Feb-2023 15:11, PREVIOUS ECG IS PRESENT  Echo 02/19/23 demonstrated  1. Left ventricular ejection fraction, by estimation, is 55 to 60%. The  left ventricle has normal function. The left ventricle has no regional  wall motion abnormalities. Left ventricular diastolic parameters are  consistent with Grade II diastolic  dysfunction (pseudonormalization).   2. Right ventricular systolic function is normal. The right ventricular  size is normal.   3. Left atrial size was mild to moderately dilated.   4. Right atrial size was  mildly dilated.   5. The mitral valve is normal in structure. Trivial mitral valve  regurgitation. No evidence of mitral stenosis.   6. The aortic valve is normal in structure. Aortic valve regurgitation is  mild. No aortic stenosis is present. Aortic valve area, by VTI measures  2.40 cm. Aortic valve mean gradient measures 3.0 mmHg. Aortic valve Vmax  measures 1.14 m/s.   7. Aortic dilatation noted. There is borderline dilatation of the aortic  root, measuring  37 mm.   8. The inferior vena cava is normal in size with greater than 50%  respiratory variability, suggesting right atrial pressure of 3 mmHg.    ASSESSMENT & PLAN CHA2DS2-VASc Score = 5  The patient's score is based upon: CHF History: 0 HTN History: 1 Diabetes History: 0 Stroke History: 2 Vascular Disease History: 0 Age Score: 1 Gender Score: 1       ASSESSMENT AND PLAN: Paroxysmal Atrial Fibrillation (ICD10:  I48.0) The patient's CHA2DS2-VASc score is 5, indicating a 7.2% annual risk of stroke.    Education provided about Afib. Discussion about triggers and treatment if indicated in the future. After discussion, we will proceed with conservative observation at this time via ILR interrogations. Rhythm monitoring device recommended.    Secondary Hypercoagulable State (ICD10:  D68.69) The patient is at significant risk for stroke/thromboembolism based upon her CHA2DS2-VASc Score of 5.  Start Apixaban (Eliquis).   After discussion of anticoagulation for prevention of stroke related to Afib, patient agrees to start medication.   Stop ASA. Begin Eliquis 5 mg BID.    Follow up 1 month to reassess burden and draw CBC.    Lake Bells, PA-C  Afib Clinic [facility] [address] [place], [place] [address] [phone]

## 2023-08-07 ENCOUNTER — Other Ambulatory Visit: Payer: BC Managed Care – PPO

## 2023-08-07 ENCOUNTER — Ambulatory Visit
Admission: RE | Admit: 2023-08-07 | Discharge: 2023-08-07 | Disposition: A | Discharge status: Home / Self Care | Payer: Medicare Other | Source: Ambulatory Visit | Attending: Family Medicine

## 2023-08-07 ENCOUNTER — Ambulatory Visit
Admission: RE | Admit: 2023-08-07 | Discharge: 2023-08-07 | Disposition: A | Discharge status: Home / Self Care | Payer: Medicare Other | Source: Ambulatory Visit | Attending: Family Medicine | Admitting: Family Medicine

## 2023-08-07 DIAGNOSIS — R928 Other abnormal and inconclusive findings on diagnostic imaging of breast: Secondary | ICD-10-CM

## 2023-08-07 HISTORY — PX: BREAST BIOPSY: SHX20

## 2023-08-09 LAB — CUP PACEART REMOTE DEVICE CHECK
Date Time Interrogation Session: 20240829094517
Implantable Pulse Generator Implant Date: 20240522

## 2023-08-14 ENCOUNTER — Ambulatory Visit (INDEPENDENT_AMBULATORY_CARE_PROVIDER_SITE_OTHER): Payer: Medicare Other

## 2023-08-14 DIAGNOSIS — I480 Paroxysmal atrial fibrillation: Secondary | ICD-10-CM

## 2023-08-21 NOTE — Progress Notes (Signed)
[facility] Summary Report / Loop Recorder 

## 2023-09-11 LAB — CUP PACEART REMOTE DEVICE CHECK
Date Time Interrogation Session: 20241001094808
Implantable Pulse Generator Implant Date: 20240522

## 2023-09-17 ENCOUNTER — Ambulatory Visit (INDEPENDENT_AMBULATORY_CARE_PROVIDER_SITE_OTHER): Payer: Medicare Other

## 2023-09-17 DIAGNOSIS — G459 Transient cerebral ischemic attack, unspecified: Secondary | ICD-10-CM

## 2023-09-19 ENCOUNTER — Ambulatory Visit (HOSPITAL_COMMUNITY)
Admission: RE | Admit: 2023-09-19 | Discharge: 2023-09-19 | Disposition: A | Discharge status: Home / Self Care | Payer: Medicare Other | Source: Ambulatory Visit | Attending: Internal Medicine | Admitting: Internal Medicine

## 2023-09-19 VITALS — BP 132/68 | HR 80 | Ht 70.0 in | Wt 137.6 lb

## 2023-09-19 DIAGNOSIS — Z7901 Long term (current) use of anticoagulants: Secondary | ICD-10-CM | POA: Diagnosis not present

## 2023-09-19 DIAGNOSIS — I480 Paroxysmal atrial fibrillation: Secondary | ICD-10-CM

## 2023-09-19 DIAGNOSIS — D6869 Other thrombophilia: Secondary | ICD-10-CM | POA: Diagnosis not present

## 2023-09-19 LAB — CBC
HCT: 44.4 % (ref 36.0–46.0)
Hemoglobin: 14.5 g/dL (ref 12.0–15.0)
MCH: 31.2 pg (ref 26.0–34.0)
MCHC: 32.7 g/dL (ref 30.0–36.0)
MCV: 95.5 fL (ref 80.0–100.0)
Platelets: 243 10*3/uL (ref 150–400)
RBC: 4.65 MIL/uL (ref 3.87–5.11)
RDW: 12.5 % (ref 11.5–15.5)
WBC: 6 10*3/uL (ref 4.0–10.5)
nRBC: 0 % (ref 0.0–0.2)

## 2023-09-19 NOTE — Progress Notes (Signed)
Primary Care Physician: Shon Hale, MD Primary Cardiologist: None Electrophysiologist: Dr. Elberta Fortis    Referring Physician: Device clinic     Cheryl Gallegos is a 72 y.o. female with a history of TIA s/p ILR placement by Dr. Elberta Fortis on 05/02/23, HTN, HLD, and paroxysmal atrial fibrillation who presents for consultation in the [facility]ibr[facility]. Device clinic alert on 8/8 for new two episodes of Afib, longest 1 hr and 58 minutes, burden 0.9%. Patient is on ASA. She has a CHADS2VASC score of 5.  On evaluation today, she is currently in NSR. She did not have any cardiac awareness of her Afib. She wears a Fitbit watch but it does not record ECG. She does not know if she snores and will ask her husband. She has an upcoming breast biopsy next week.   On follow up 10/9, she is currently in NSR. Review of ILR reports since last OV showed no arrhythmias on most recent device interrogation. She feels well overall and does not have any bleeding issues on Eliquis.   Today, she denies symptoms of palpitations, chest pain, shortness of breath, orthopnea, PND, lower extremity edema, dizziness, presyncope, syncope, snoring, daytime somnolence, bleeding, or neurologic sequela. The patient is tolerating medications without difficulties and is otherwise without complaint today.    she has a BMI of Body mass index is 19.74 kg/m.Marland Kitchen Filed Weights   09/19/23 0943  Weight: 62.4 kg     Current Outpatient Medications  Medication Sig Dispense Refill   alendronate (FOSAMAX) 70 MG tablet Take 70 mg by mouth every Thursday.     amLODipine (NORVASC) 5 MG tablet Take 5 mg by mouth daily.     apixaban (ELIQUIS) 5 MG TABS tablet Take 1 tablet (5 mg total) by mouth 2 (two) times daily. 60 tablet 3   lisinopril (ZESTRIL) 20 MG tablet Take 1 tablet (20 mg total) by mouth daily.     minocycline (MINOCIN) 50 MG capsule Take 50 mg by mouth as needed (for rash/hives).     rosuvastatin  (CRESTOR) 20 MG tablet Take 1 tablet (20 mg total) by mouth daily. 60 tablet 0   No current facility-administered medications for this encounter.    Atrial Fibrillation Management history:  Previous antiarrhythmic drugs: None Previous cardioversions: None Previous ablations: None Anticoagulation history: Eliquis   ROS- All systems are reviewed and negative except as per the HPI above.  Physical Exam: BP 132/68   Pulse 80   Ht 5\' 10"  (1.778 m)   Wt 62.4 kg   BMI 19.74 kg/m   GEN- The patient is well appearing, alert and oriented x 3 today.   Neck - no JVD or carotid bruit noted Lungs- Clear to ausculation bilaterally, normal work of breathing Heart- Regular rate and rhythm, no murmurs, rubs or gallops, PMI not laterally displaced Extremities- no clubbing, cyanosis, or edema Skin - no rash or ecchymosis noted   EKG today demonstrates  Vent. rate 80 BPM PR interval 154 ms QRS duration 88 ms QT/QTcB 400/461 ms P-R-T axes 77 -33 48 Sinus rhythm with Premature atrial complexes Left axis deviation Abnormal ECG When compared with ECG of 02-Aug-2023 09:40, PREVIOUS ECG IS PRESENT  Echo 02/19/23 demonstrated  1. Left ventricular ejection fraction, by estimation, is 55 to 60%. The  left ventricle has normal function. The left ventricle has no regional  wall motion abnormalities. Left ventricular diastolic parameters are  consistent with Grade II diastolic  dysfunction (pseudonormalization).   2. Right  ventricular systolic function is normal. The right ventricular  size is normal.   3. Left atrial size was mild to moderately dilated.   4. Right atrial size was mildly dilated.   5. The mitral valve is normal in structure. Trivial mitral valve  regurgitation. No evidence of mitral stenosis.   6. The aortic valve is normal in structure. Aortic valve regurgitation is  mild. No aortic stenosis is present. Aortic valve area, by VTI measures  2.40 cm. Aortic valve mean gradient  measures 3.0 mmHg. Aortic valve Vmax  measures 1.14 m/s.   7. Aortic dilatation noted. There is borderline dilatation of the aortic  root, measuring 37 mm.   8. The inferior vena cava is normal in size with greater than 50%  respiratory variability, suggesting right atrial pressure of 3 mmHg.    ASSESSMENT & PLAN CHA2DS2-VASc Score = 5  The patient's score is based upon: CHF History: 0 HTN History: 1 Diabetes History: 0 Stroke History: 2 Vascular Disease History: 0 Age Score: 1 Gender Score: 1       ASSESSMENT AND PLAN: Paroxysmal Atrial Fibrillation (ICD10:  I48.0) The patient's CHA2DS2-VASc score is 5, indicating a 7.2% annual risk of stroke.    She is currently in NSR.  Discussion with patient on conservative observation. After discussion, we will proceed with conservative observation at this time via ILR interrogations. She wishes to see [place] prn which is reasonable.    Secondary Hypercoagulable State (ICD10:  D68.69) The patient is at significant risk for stroke/thromboembolism based upon her CHA2DS2-VASc Score of 5.  Start Apixaban (Eliquis).  No missed doses of Eliquis 5 mg BID. CBC drawn today.   Follow up Afib clinic prn.   Lake Bells, PA-C  Afib Clinic [facility] [address] [place], [place] [address] [phone]

## 2023-09-28 NOTE — Progress Notes (Signed)
[facility] Summary Report / Loop Recorder 

## 2023-10-22 ENCOUNTER — Ambulatory Visit (INDEPENDENT_AMBULATORY_CARE_PROVIDER_SITE_OTHER): Payer: BC Managed Care – PPO

## 2023-10-22 DIAGNOSIS — G459 Transient cerebral ischemic attack, unspecified: Secondary | ICD-10-CM

## 2023-10-22 LAB — CUP PACEART REMOTE DEVICE CHECK
Date Time Interrogation Session: 20241110232142
Implantable Pulse Generator Implant Date: 20240522

## 2023-11-14 NOTE — Progress Notes (Signed)
[facility] Summary Report / Loop Recorder 

## 2023-11-26 ENCOUNTER — Ambulatory Visit (INDEPENDENT_AMBULATORY_CARE_PROVIDER_SITE_OTHER): Payer: BC Managed Care – PPO

## 2023-11-26 DIAGNOSIS — G459 Transient cerebral ischemic attack, unspecified: Secondary | ICD-10-CM

## 2023-11-26 DIAGNOSIS — I480 Paroxysmal atrial fibrillation: Secondary | ICD-10-CM

## 2023-11-26 LAB — CUP PACEART REMOTE DEVICE CHECK
Date Time Interrogation Session: 20241215231724
Implantable Pulse Generator Implant Date: 20240522

## 2023-11-27 ENCOUNTER — Other Ambulatory Visit: Payer: Self-pay | Admitting: Medical Genetics

## 2023-11-28 ENCOUNTER — Other Ambulatory Visit (HOSPITAL_COMMUNITY): Payer: Self-pay | Admitting: Internal Medicine

## 2023-12-11 ENCOUNTER — Telehealth: Payer: Self-pay | Admitting: Cardiology

## 2023-12-11 ENCOUNTER — Other Ambulatory Visit (HOSPITAL_COMMUNITY): Payer: Self-pay | Admitting: Family Medicine

## 2023-12-11 DIAGNOSIS — R2242 Localized swelling, mass and lump, left lower limb: Secondary | ICD-10-CM

## 2023-12-11 NOTE — Telephone Encounter (Signed)
 Patient c/o Palpitations:  High priority if patient c/o lightheadedness, shortness of breath, or chest pain  How long have you had palpitations/irregular HR/ Afib? Are you having the symptoms now?  Patient saw PCP who reviewed her chart made patient aware of afib episode that occurred on 12/17. Patient states PCP recommended starting on Metoprolol .  Are you currently experiencing lightheadedness, SOB or CP?  No   Do you have a history of afib (atrial fibrillation) or irregular heart rhythm?  Yes   Have you checked your BP or HR? (document readings if available):  12/31: 134/80 81  Are you experiencing any other symptoms?  No patient states she is asymptomatic and feels fine

## 2023-12-11 NOTE — Telephone Encounter (Signed)
 Patient states PCP made her aware that a message was sent to our office recommending patient start metoprolol  for her AFIB. She states PCP suggested she give us  a call being that our office has not responded yet.  She wanted to make sure Dr. Inocencio is aware.

## 2023-12-18 ENCOUNTER — Ambulatory Visit (HOSPITAL_COMMUNITY)
Admission: RE | Admit: 2023-12-18 | Discharge: 2023-12-18 | Disposition: A | Discharge status: Home / Self Care | Payer: Medicare PPO | Source: Ambulatory Visit | Attending: Internal Medicine | Admitting: Internal Medicine

## 2023-12-18 VITALS — BP 132/70 | HR 81 | Ht 70.0 in | Wt 135.2 lb

## 2023-12-18 DIAGNOSIS — I480 Paroxysmal atrial fibrillation: Secondary | ICD-10-CM | POA: Diagnosis not present

## 2023-12-18 DIAGNOSIS — I4891 Unspecified atrial fibrillation: Secondary | ICD-10-CM

## 2023-12-18 DIAGNOSIS — Z7982 Long term (current) use of aspirin: Secondary | ICD-10-CM | POA: Insufficient documentation

## 2023-12-18 DIAGNOSIS — Z7901 Long term (current) use of anticoagulants: Secondary | ICD-10-CM | POA: Diagnosis not present

## 2023-12-18 DIAGNOSIS — Z8673 Personal history of transient ischemic attack (TIA), and cerebral infarction without residual deficits: Secondary | ICD-10-CM | POA: Insufficient documentation

## 2023-12-18 DIAGNOSIS — I10 Essential (primary) hypertension: Secondary | ICD-10-CM | POA: Insufficient documentation

## 2023-12-18 DIAGNOSIS — D6869 Other thrombophilia: Secondary | ICD-10-CM | POA: Diagnosis not present

## 2023-12-18 MED ORDER — METOPROLOL SUCCINATE ER 25 MG PO TB24: 25.0000 mg | ORAL_TABLET | Freq: Every day | ORAL | 6 refills | Status: DC

## 2023-12-18 NOTE — Progress Notes (Signed)
 Primary Care Physician: Chrystal Lamarr RAMAN, MD Primary Cardiologist: None Electrophysiologist: Dr. Inocencio    Referring Physician: Device clinic     Cheryl Gallegos is a 73 y.o. female with a history of TIA s/p ILR placement by Dr. Inocencio on 05/02/23, HTN, HLD, and paroxysmal atrial fibrillation who presents for consultation in the [facility]. Device clinic alert on 8/8 for new two episodes of Afib, longest 1 hr and 58 minutes, burden 0.9%. Patient is on ASA. She has a CHADS2VASC score of 5.  On evaluation today, she is currently in NSR. She did not have any cardiac awareness of her Afib. She wears a Fitbit watch but it does not record ECG. She does not know if she snores and will ask her husband. She has an upcoming breast biopsy next week.   On follow up 10/9, she is currently in NSR. Review of ILR reports since last OV showed no arrhythmias on most recent device interrogation. She feels well overall and does not have any bleeding issues on Eliquis .   On follow up 12/18/23, she is currently in NSR. Review of ILR shows an episode of Afib that lasted for 8 hours on 12/01/23. Most recently, she had a 5 second episode on Christmas Eve and a 2 minute episode earlier this morning. Dr. Inocencio reviewed ILR data on 12/17 and recommended patient start Toprol  50 mg daily due to rapid PAF. She has not missed any doses of Eliquis .   Today, she denies symptoms of palpitations, chest pain, shortness of breath, orthopnea, PND, lower extremity edema, dizziness, presyncope, syncope, snoring, daytime somnolence, bleeding, or neurologic sequela. The patient is tolerating medications without difficulties and is otherwise without complaint today.    she has a BMI of Body mass index is 19.4 kg/m.SABRA Filed Weights   12/18/23 0901  Weight: 61.3 kg    Current Outpatient Medications  Medication Sig Dispense Refill   alendronate (FOSAMAX) 70 MG tablet Take 70 mg by mouth every  Thursday.     amLODipine (NORVASC) 5 MG tablet Take 5 mg by mouth daily.     apixaban  (ELIQUIS ) 5 MG TABS tablet TAKE 1 TABLET BY MOUTH TWICE A DAY 60 tablet 6   clindamycin-benzoyl peroxide (BENZACLIN) gel Apply 1 Application topically as needed.     lisinopril  (ZESTRIL ) 20 MG tablet Take 1 tablet (20 mg total) by mouth daily.     metoprolol  succinate (TOPROL  XL) 25 MG 24 hr tablet Take 1 tablet (25 mg total) by mouth daily. Take with or immediately following a meal. 30 tablet 6   minocycline (MINOCIN) 50 MG capsule Take 50 mg by mouth as needed (for rash/hives).     rosuvastatin  (CRESTOR ) 20 MG tablet Take 1 tablet (20 mg total) by mouth daily. 60 tablet 0   No current facility-administered medications for this encounter.    Atrial Fibrillation Management history:  Previous antiarrhythmic drugs: None Previous cardioversions: None Previous ablations: None Anticoagulation history: Eliquis    ROS- All systems are reviewed and negative except as per the HPI above.  Physical Exam: BP 132/70   Pulse 81   Ht 5' 10 (1.778 m)   Wt 61.3 kg   BMI 19.40 kg/m   GEN- The patient is well appearing, alert and oriented x 3 today.   Neck - no JVD or carotid bruit noted Lungs- Clear to ausculation bilaterally, normal work of breathing Heart- Regular rate and rhythm, no murmurs, rubs or gallops, PMI not laterally displaced Extremities-  no clubbing, cyanosis, or edema Skin - no rash or ecchymosis noted   EKG today demonstrates  Vent. rate 81 BPM PR interval 152 ms QRS duration 86 ms QT/QTcB 400/464 ms P-R-T axes 75 14 58 Normal sinus rhythm Normal ECG When compared with ECG of 19-Sep-2023 09:53, PREVIOUS ECG IS PRESENT  Echo 02/19/23 demonstrated  1. Left ventricular ejection fraction, by estimation, is 55 to 60%. The  left ventricle has normal function. The left ventricle has no regional  wall motion abnormalities. Left ventricular diastolic parameters are  consistent with Grade II  diastolic  dysfunction (pseudonormalization).   2. Right ventricular systolic function is normal. The right ventricular  size is normal.   3. Left atrial size was mild to moderately dilated.   4. Right atrial size was mildly dilated.   5. The mitral valve is normal in structure. Trivial mitral valve  regurgitation. No evidence of mitral stenosis.   6. The aortic valve is normal in structure. Aortic valve regurgitation is  mild. No aortic stenosis is present. Aortic valve area, by VTI measures  2.40 cm. Aortic valve mean gradient measures 3.0 mmHg. Aortic valve Vmax  measures 1.14 m/s.   7. Aortic dilatation noted. There is borderline dilatation of the aortic  root, measuring 37 mm.   8. The inferior vena cava is normal in size with greater than 50%  respiratory variability, suggesting right atrial pressure of 3 mmHg.    ASSESSMENT & PLAN CHA2DS2-VASc Score = 5  The patient's score is based upon: CHF History: 0 HTN History: 1 Diabetes History: 0 Stroke History: 2 Vascular Disease History: 0 Age Score: 1 Gender Score: 1       ASSESSMENT AND PLAN: Paroxysmal Atrial Fibrillation (ICD10:  I48.0) The patient's CHA2DS2-VASc score is 5, indicating a 7.2% annual risk of stroke.    She is currently in NSR.  Discussion with patient regarding Dr. Carolyn recommendations to begin beta blocker due to rapid heart rates on ILR. She is in agreement to this. Noted allergy of beta blocker due to orthostatic hypotension. Will begin low dose Toprol  25 mg at bedtime and observe how she does on this.   Secondary Hypercoagulable State (ICD10:  D68.69) The patient is at significant risk for stroke/thromboembolism based upon her CHA2DS2-VASc Score of 5.  Start Apixaban  (Eliquis ).  No missed doses of Eliquis  5 mg BID.    Follow up Afib clinic 1 year.   Terra Pac, PA-C  Afib Clinic [facility] [address] [place], [place] [address] [phone]

## 2023-12-18 NOTE — Patient Instructions (Signed)
 Start metoprolol 25mg  once a day    Follow up 1 year

## 2023-12-23 ENCOUNTER — Encounter: Payer: Self-pay | Admitting: Cardiology

## 2023-12-25 ENCOUNTER — Ambulatory Visit (HOSPITAL_COMMUNITY)
Admission: RE | Admit: 2023-12-25 | Discharge: 2023-12-25 | Disposition: A | Discharge status: Home / Self Care | Payer: Medicare PPO | Source: Ambulatory Visit | Attending: Family Medicine | Admitting: Family Medicine

## 2023-12-25 DIAGNOSIS — R2242 Localized swelling, mass and lump, left lower limb: Secondary | ICD-10-CM | POA: Insufficient documentation

## 2023-12-27 ENCOUNTER — Other Ambulatory Visit (HOSPITAL_COMMUNITY): Payer: Self-pay | Admitting: Family Medicine

## 2023-12-27 ENCOUNTER — Encounter (HOSPITAL_COMMUNITY): Payer: Self-pay | Admitting: Family Medicine

## 2023-12-27 DIAGNOSIS — R2242 Localized swelling, mass and lump, left lower limb: Secondary | ICD-10-CM

## 2023-12-28 ENCOUNTER — Telehealth: Payer: Self-pay

## 2023-12-28 NOTE — Telephone Encounter (Signed)
   Pre-operative Risk Assessment    Patient Name: Cheryl Gallegos  DOB: [birth year] MRN: [id number]   Date of last office visit: 12/18/23 Date of next office visit: Not scheduled   Request for Surgical Clearance    Procedure:   Ultrasound Core Biopsy Soft Tissue  Date of Surgery:  Clearance 01/15/24                                Surgeon:  Not indicated Surgeon's Group or Practice Name:  [facility] Radiology Department Phone number:  [phone] Fax number:  [phone]   Type of Clearance Requested:   - Medical  - Pharmacy:  Hold Apixaban (Eliquis)     Type of Anesthesia:  Not Indicated   Additional requests/questions:    Garrel Ridgel   12/28/2023, 12:33 PM

## 2023-12-28 NOTE — Progress Notes (Signed)
Sterling Big, MD  Claudean Kinds Approved for US guided core biopsy of subQ mass, left lateral hip.  HKM       Previous Messages    ----- Message ----- From: Claudean Kinds Sent: 12/27/2023   3:38 PM EST To: Claudean Kinds; Ir Procedure Requests Subject: [place] core biopsy ( soft tissue )                Procedure : [place] Core Biopsy ( Soft tissue)  Reason: Mass od left lower extremity - need bx of L Lateral hip mass rul out rapidly enhancing malignancy like sarcoma , sch within 1-2 wks please Dx: Mass of left lower extremity [R22.42 (ICD-10-CM)]  Ordering Comments  Need call report . NO PA needed    History : [place] lower extrem ltd soft tissue  Provider :Shon Hale, MD  Provider contact :  [phone]

## 2023-12-31 ENCOUNTER — Ambulatory Visit (INDEPENDENT_AMBULATORY_CARE_PROVIDER_SITE_OTHER): Payer: Medicare PPO

## 2023-12-31 DIAGNOSIS — G459 Transient cerebral ischemic attack, unspecified: Secondary | ICD-10-CM

## 2023-12-31 DIAGNOSIS — I480 Paroxysmal atrial fibrillation: Secondary | ICD-10-CM

## 2023-12-31 LAB — CUP PACEART REMOTE DEVICE CHECK
Date Time Interrogation Session: 20250119232240
Implantable Pulse Generator Implant Date: 20240522

## 2023-12-31 NOTE — Progress Notes (Signed)
[facility] Summary Report / Loop Recorder 

## 2024-01-01 NOTE — Telephone Encounter (Signed)
   Name: Cheryl Gallegos  DOB: [birth year]  MRN: [id number]  Primary Cardiologist: None   Preoperative team, please contact this patient and set up a phone call appointment for further preoperative risk assessment. Please obtain consent and complete medication review. Thank you for your help.  I confirm that guidance regarding antiplatelet and oral anticoagulation therapy has been completed and, if necessary, noted below.  Due to history of TIA last year, recommend one day Eliquis hold    Patient will not need bridging with Lovenox (enoxaparin) around procedure.  I also confirmed the patient resides in the state of [place]. As per [facility] Medical Board telemedicine laws, the patient must reside in the state in which the provider is licensed.   Denyce Robert, NP 01/01/2024, 10:47 AM [facility]

## 2024-01-01 NOTE — Telephone Encounter (Signed)
Patient with diagnosis of A Fib on Eliquis for anticoagulation.    Procedure: Ultrasound Core Biopsy Soft Tissue  Date of procedure: 01/15/24   CHA2DS2-VASc Score = 5  This indicates a 7.2% annual risk of stroke. The patient's score is based upon: CHF History: 0 HTN History: 1 Diabetes History: 0 Stroke History: 2 Vascular Disease History: 0 Age Score: 1 Gender Score: 1   CrCl 56 mL/min Platelet count 243K  Due to history of TIA last year, recommend one day Eliquis hold   Patient /will not need bridging with Lovenox (enoxaparin) around procedure.  **This guidance is not considered finalized until pre-operative APP has relayed final recommendations.**

## 2024-01-01 NOTE — Telephone Encounter (Signed)
Left message to call back to schedule tele pre op appt.  

## 2024-01-02 NOTE — Telephone Encounter (Signed)
2nd attempt to reach pt to schedule a tele pre op appt.  

## 2024-01-03 ENCOUNTER — Emergency Department (HOSPITAL_BASED_OUTPATIENT_CLINIC_OR_DEPARTMENT_OTHER): Payer: Medicare PPO

## 2024-01-03 ENCOUNTER — Emergency Department (HOSPITAL_BASED_OUTPATIENT_CLINIC_OR_DEPARTMENT_OTHER)
Admission: EM | Admit: 2024-01-03 | Discharge: 2024-01-03 | Disposition: A | Discharge status: Home / Self Care | Payer: Medicare PPO | Attending: Emergency Medicine | Admitting: Emergency Medicine

## 2024-01-03 ENCOUNTER — Other Ambulatory Visit: Payer: Self-pay

## 2024-01-03 ENCOUNTER — Encounter (HOSPITAL_BASED_OUTPATIENT_CLINIC_OR_DEPARTMENT_OTHER): Payer: Self-pay | Admitting: *Deleted

## 2024-01-03 DIAGNOSIS — I10 Essential (primary) hypertension: Secondary | ICD-10-CM | POA: Insufficient documentation

## 2024-01-03 DIAGNOSIS — Z79899 Other long term (current) drug therapy: Secondary | ICD-10-CM | POA: Diagnosis not present

## 2024-01-03 DIAGNOSIS — R2990 Unspecified symptoms and signs involving the nervous system: Secondary | ICD-10-CM

## 2024-01-03 DIAGNOSIS — R4701 Aphasia: Secondary | ICD-10-CM

## 2024-01-03 DIAGNOSIS — Z7901 Long term (current) use of anticoagulants: Secondary | ICD-10-CM | POA: Insufficient documentation

## 2024-01-03 LAB — TROPONIN I (HIGH SENSITIVITY)
Troponin I (High Sensitivity): 3 ng/L (ref <18)
Troponin I (High Sensitivity): 4 ng/L (ref <18)

## 2024-01-03 LAB — DIFFERENTIAL
Abs Immature Granulocytes: 0.02 10*3/uL (ref 0.00–0.07)
Basophils Absolute: 0 10*3/uL (ref 0.0–0.1)
Basophils Relative: 0 %
Eosinophils Absolute: 0.1 10*3/uL (ref 0.0–0.5)
Eosinophils Relative: 2 %
Immature Granulocytes: 0 %
Lymphocytes Relative: 21 %
Lymphs Abs: 1.5 10*3/uL (ref 0.7–4.0)
Monocytes Absolute: 0.6 10*3/uL (ref 0.1–1.0)
Monocytes Relative: 8 %
Neutro Abs: 4.9 10*3/uL (ref 1.7–7.7)
Neutrophils Relative %: 69 %

## 2024-01-03 LAB — ETHANOL: Alcohol, Ethyl (B): <10 mg/dL (ref <10)

## 2024-01-03 LAB — RAPID URINE DRUG SCREEN, HOSP PERFORMED
Amphetamines: NOT DETECTED
Barbiturates: NOT DETECTED
Benzodiazepines: NOT DETECTED
Cocaine: NOT DETECTED
Opiates: NOT DETECTED
Tetrahydrocannabinol: NOT DETECTED

## 2024-01-03 LAB — COMPREHENSIVE METABOLIC PANEL
ALT: 27 U/L (ref 0–44)
AST: 26 U/L (ref 15–41)
Albumin: 4.8 g/dL (ref 3.5–5.0)
Alkaline Phosphatase: 50 U/L (ref 38–126)
Anion gap: 11 (ref 5–15)
BUN: 12 mg/dL (ref 8–23)
CO2: 24 mmol/L (ref 22–32)
Calcium: 9.4 mg/dL (ref 8.9–10.3)
Chloride: 100 mmol/L (ref 98–111)
Creatinine, Ser: 0.71 mg/dL (ref 0.44–1.00)
GFR, Estimated: >60 mL/min (ref >60)
Glucose, Bld: 101 mg/dL — ABNORMAL HIGH (ref 70–99)
Potassium: 3.9 mmol/L (ref 3.5–5.1)
Sodium: 135 mmol/L (ref 135–145)
Total Bilirubin: 0.8 mg/dL (ref 0.0–1.2)
Total Protein: 7.3 g/dL (ref 6.5–8.1)

## 2024-01-03 LAB — URINALYSIS, ROUTINE W REFLEX MICROSCOPIC
Bilirubin Urine: NEGATIVE
Glucose, UA: 100 mg/dL — AB
Hgb urine dipstick: NEGATIVE
Ketones, ur: NEGATIVE mg/dL
Leukocytes,Ua: NEGATIVE
Nitrite: NEGATIVE
Protein, ur: NEGATIVE mg/dL
Specific Gravity, Urine: <=1.005 (ref 1.005–1.030)
pH: 5.5 (ref 5.0–8.0)

## 2024-01-03 LAB — CBC
HCT: 42.1 % (ref 36.0–46.0)
Hemoglobin: 14.3 g/dL (ref 12.0–15.0)
MCH: 31.8 pg (ref 26.0–34.0)
MCHC: 34 g/dL (ref 30.0–36.0)
MCV: 93.8 fL (ref 80.0–100.0)
Platelets: 213 10*3/uL (ref 150–400)
RBC: 4.49 MIL/uL (ref 3.87–5.11)
RDW: 12.6 % (ref 11.5–15.5)
WBC: 7.1 10*3/uL (ref 4.0–10.5)
nRBC: 0 % (ref 0.0–0.2)

## 2024-01-03 LAB — CBG MONITORING, ED
Glucose-Capillary: 175 mg/dL — ABNORMAL HIGH (ref 70–99)
Glucose-Capillary: 61 mg/dL — ABNORMAL LOW (ref 70–99)

## 2024-01-03 LAB — APTT: aPTT: 31 s (ref 24–36)

## 2024-01-03 LAB — PROTIME-INR
INR: 1.1 (ref 0.8–1.2)
Prothrombin Time: 14.5 s (ref 11.4–15.2)

## 2024-01-03 MED ORDER — DEXTROSE 50 % IV SOLN
1.0000 | Freq: Once | INTRAVENOUS | Status: AC
  Administered 2024-01-03: 50 mL via INTRAVENOUS

## 2024-01-03 MED ORDER — DEXTROSE 50 % IV SOLN
INTRAVENOUS | Status: AC
  Filled 2024-01-03: qty 50

## 2024-01-03 MED ORDER — IOHEXOL 350 MG/ML SOLN
75.0000 mL | Freq: Once | INTRAVENOUS | Status: AC | PRN
  Administered 2024-01-03: 75 mL via INTRAVENOUS

## 2024-01-03 NOTE — Telephone Encounter (Signed)
Tried calling patient no answer left a vm to call back

## 2024-01-03 NOTE — ED Provider Notes (Signed)
[facility] Provider Note   CSN: [id number] Arrival date & time: 01/03/24  1130     History  Chief Complaint  Patient presents with   Aphasia   Tachycardia    Cheryl Gallegos is a 73 y.o. female.  Patient is a 73 year old female with past medical history of TIA, paroxysmal A-fib on Eliquis, and hypertension presenting for difficulty with word finding.  Patient states she was at the gym and was sitting down when she began having brief episode of difficulty speaking.  Episode occurred approximately 30 minutes ago at 11:15 AM.  She says she could not find the words or get her words out correctly.  Her husband is there and witnessed the event.  She had no slurred speech.  No facial droop.  No sensation or motor dysfunction.  Symptoms are resolved at this time.  She denies an episode of severe headache.  She denies recent falls or head injuries.  The history is provided by the patient. No language interpreter was used.       Home Medications Prior to Admission medications   Medication Sig Start Date End Date Taking? Authorizing Provider  alendronate (FOSAMAX) 70 MG tablet Take 70 mg by mouth every Thursday.    [provider]  amLODipine (NORVASC) 5 MG tablet Take 5 mg by mouth daily.    [provider]  apixaban (ELIQUIS) 5 MG TABS tablet TAKE 1 TABLET BY MOUTH TWICE A DAY 11/28/23   Eustace Pen, PA-C  clindamycin-benzoyl peroxide (BENZACLIN) gel Apply 1 Application topically as needed. 12/11/23   [provider]  lisinopril (ZESTRIL) 20 MG tablet Take 1 tablet (20 mg total) by mouth daily. 02/22/23   Rolly Salter, MD  metoprolol succinate (TOPROL XL) 25 MG 24 hr tablet Take 1 tablet (25 mg total) by mouth daily. Take with or immediately following a meal. 12/18/23   Eustace Pen, PA-C  minocycline (MINOCIN) 50 MG capsule Take 50 mg by mouth as needed (for rash/hives). 02/03/23   [provider]   rosuvastatin (CRESTOR) 20 MG tablet Take 1 tablet (20 mg total) by mouth daily. 02/20/23   Rolly Salter, MD      Allergies    Beta adrenergic blockers, Ibuprofen, Tomato, and Chocolate    Review of Systems   Review of Systems  Constitutional:  Negative for chills and fever.  HENT:  Negative for ear pain and sore throat.   Eyes:  Negative for pain and visual disturbance.  Respiratory:  Negative for cough and shortness of breath.   Cardiovascular:  Negative for chest pain and palpitations.  Gastrointestinal:  Negative for abdominal pain and vomiting.  Genitourinary:  Negative for dysuria and hematuria.  Musculoskeletal:  Negative for arthralgias and back pain.  Skin:  Negative for color change and rash.  Neurological:  Negative for seizures and syncope.  All other systems reviewed and are negative.   Physical Exam Updated Vital Signs BP 138/82   Pulse 70   Temp 98 F (36.7 C) (Oral)   Resp (!) 23   Wt 61.2 kg   SpO2 100%   BMI 19.37 kg/m  Physical Exam Vitals and nursing note reviewed.  Constitutional:      General: She is not in acute distress.    Appearance: She is well-developed.  HENT:     Head: Normocephalic and atraumatic.  Eyes:     General: Lids are normal. Vision grossly intact.  Conjunctiva/sclera: Conjunctivae normal.     Pupils: Pupils are equal, round, and reactive to light.  Cardiovascular:     Rate and Rhythm: Normal rate and regular rhythm.     Heart sounds: No murmur heard. Pulmonary:     Effort: Pulmonary effort is normal. No respiratory distress.     Breath sounds: Normal breath sounds.  Abdominal:     Palpations: Abdomen is soft.     Tenderness: There is no abdominal tenderness.  Musculoskeletal:        General: No swelling.     Cervical back: Neck supple.  Skin:    General: Skin is warm and dry.     Capillary Refill: Capillary refill takes less than 2 seconds.  Neurological:     General: No focal deficit present.     Mental Status:  She is alert and oriented to person, place, and time.     GCS: GCS eye subscore is 4. GCS verbal subscore is 5. GCS motor subscore is 6.     Cranial Nerves: Cranial nerves 2-12 are intact.     Sensory: Sensation is intact.     Motor: Motor function is intact.     Coordination: Coordination is intact.  Psychiatric:        Mood and Affect: Mood normal.     ED Results / Procedures / Treatments   Labs (all labs ordered are listed, but only abnormal results are displayed) Labs Reviewed  COMPREHENSIVE METABOLIC PANEL - Abnormal; Notable for the following components:      Result Value   Glucose, Bld 101 (*)    All other components within normal limits  URINALYSIS, ROUTINE W REFLEX MICROSCOPIC - Abnormal; Notable for the following components:   Glucose, UA 100 (*)    All other components within normal limits  CBG MONITORING, ED - Abnormal; Notable for the following components:   Glucose-Capillary 61 (*)    All other components within normal limits  CBG MONITORING, ED - Abnormal; Notable for the following components:   Glucose-Capillary 175 (*)    All other components within normal limits  ETHANOL  PROTIME-INR  APTT  CBC  DIFFERENTIAL  RAPID URINE DRUG SCREEN, HOSP PERFORMED  TROPONIN I (HIGH SENSITIVITY)  TROPONIN I (HIGH SENSITIVITY)    EKG EKG Interpretation Date/Time:  Thursday January 03 2024 12:14:06 EST Ventricular Rate:  67 PR Interval:  159 QRS Duration:  101 QT Interval:  435 QTC Calculation: 460 R Axis:   -33  Text Interpretation: Sinus rhythm Atrial premature complex Left axis deviation RSR' in V1 or V2, probably normal variant Confirmed by Edwin Dada ([id number]) on 01/03/2024 12:38:34 PM  Radiology CT ANGIO HEAD NECK W WO CM (CODE STROKE) Result Date: 01/03/2024 CLINICAL DATA:  Neuro deficit. Stroke suspected. Word-finding difficulty. EXAM: CT ANGIOGRAPHY HEAD AND NECK WITH AND WITHOUT CONTRAST TECHNIQUE: Multidetector CT imaging of the head and neck was performed  using the standard protocol during bolus administration of intravenous contrast. Multiplanar CT image reconstructions and MIPs were obtained to evaluate the vascular anatomy. Carotid stenosis measurements (when applicable) are obtained utilizing NASCET criteria, using the distal internal carotid diameter as the denominator. RADIATION DOSE REDUCTION: This exam was performed according to the departmental dose-optimization program which includes automated exposure control, adjustment of the mA and/or kV according to patient size and/or use of iterative reconstruction technique. CONTRAST:  75mL OMNIPAQUE IOHEXOL 350 MG/ML SOLN COMPARISON:  CT head without contrast 01/03/2024. CT angio head and neck 02/19/2023. FINDINGS: CTA NECK FINDINGS Aortic  arch: A 4 vessel arch configuration is present. Left vertebral artery originates directly from the aortic arch, a normal variant. A near common origin of the left common carotid artery in the innominate artery is noted. Minimal atherosclerotic calcifications are present on the undersurface of the aortic arch. No aneurysm or stenosis is present. No dissection is present. Right carotid system: The right common carotid artery is within normal limits. Bifurcation is unremarkable. Mild tortuosity is present cervical right ICA without focal stenosis. Left carotid system: The left common carotid artery is within normal limits. Bifurcation is unremarkable. Mild tortuosity is present cervical left ICA without focal stenosis. Vertebral arteries: The right vertebral artery is the dominant vessel. Right vertebral artery originates from the subclavian artery without significant stenosis. No significant stenosis is present in either vertebral artery in the neck. Skeleton: Multilevel degenerative changes are present cervical spine. Chronic endplate changes are present at C4-5, C5-6 and C6-7. Mild straightening of the normal cervical lordosis is present. No focal osseous lesions are present.  Other neck: Soft tissues the neck are otherwise unremarkable. Salivary glands are within normal limits. Thyroid is normal. No significant adenopathy is present. No focal mucosal or submucosal lesions are present. Upper chest: Minimal atelectasis is present in the superior segments of the lower lobes bilaterally. No nodule or mass lesion is present. No pleural effusion or pneumothorax is present. Multiple metallic densities project over the left upper chest. Implanted loop recorder is noted. Review of the MIP images confirms the above findings CTA HEAD FINDINGS Anterior circulation: Atherosclerotic calcifications are present within the cavernous internal carotid arteries bilaterally without focal stenosis through the ICA terminus. The aorta scratched at the left A1 segment is dominant. The anterior communicating artery is patent. Both A2 segments fill predominantly from the left. The M1 segments are normal. ACA and MCA branch vessels are within normal limits bilaterally. Posterior circulation: The PICA origins are visualized and normal bilaterally. The vertebrobasilar junction and basilar artery are normal. Both posterior cerebral arteries originate from the basilar tip. The anterior cerebral arteries are patent bilaterally. PCA branch vessels are within normal limits bilaterally. Venous sinuses: The dural sinuses are patent. The straight sinus and deep cerebral veins are intact. Cortical veins are within normal limits. No significant vascular malformation is evident. Anatomic variants: None Review of the MIP images confirms the above findings IMPRESSION: 1. No emergent large vessel occlusion. 2. Mild tortuosity of the cervical internal carotid arteries bilaterally without focal stenosis. This likely reflects chronic hypertension. 3. Multilevel degenerative changes of the cervical spine. 4.  Aortic Atherosclerosis (ICD10-I70.0). Electronically Signed   By: Marin Roberts M.D.   On: 01/03/2024 12:20   CT HEAD  CODE STROKE WO CONTRAST Result Date: 01/03/2024 CLINICAL DATA:  Code stroke. Neuro deficit, acute, stroke suspected. Word-finding difficulty. EXAM: CT HEAD WITHOUT CONTRAST TECHNIQUE: Contiguous axial images were obtained from the base of the skull through the vertex without intravenous contrast. RADIATION DOSE REDUCTION: This exam was performed according to the departmental dose-optimization program which includes automated exposure control, adjustment of the mA and/or kV according to patient size and/or use of iterative reconstruction technique. COMPARISON:  CT head 02/18/2023. FINDINGS: Brain: Mild atrophy and white matter changes are stable. No acute infarct, hemorrhage, or mass lesion is present. Deep brain nuclei are within normal limits. The ventricles are of normal size. No significant extraaxial fluid collection is present. The brainstem and cerebellum are within normal limits. Midline structures are within normal limits. Vascular: Atherosclerotic calcifications are present within the  cavernous internal carotid arteries. No hyperdense vessel is present. Skull: Calvarium is intact. No focal lytic or blastic lesions are present. No significant extracranial soft tissue lesion is present. Sinuses/Orbits: Minimal mucosal thickening is present in the inferior left maxillary sinus. No fluid levels are present. The paranasal sinuses and mastoid air cells are otherwise clear. The globes and orbits are within normal limits. ASPECTS [facility] Stroke Program Early CT Score) - Ganglionic level infarction (caudate, lentiform nuclei, internal capsule, insula, M1-M3 cortex): 7/7 - Supraganglionic infarction (M4-M6 cortex): 3/3 Total score (0-10 with 10 being normal): 10/10 IMPRESSION: 1. No acute intracranial abnormality or significant interval change. 2. Stable atrophy and white matter disease. This likely reflects the sequela of chronic microvascular ischemia. 3. Aspects is 10/10. Electronically Signed   By: Marin Roberts M.D.   On: 01/03/2024 12:12    Procedures Procedures    Medications Ordered in ED Medications  dextrose 50 % solution (  Not Given 01/03/24 1227)  dextrose 50 % solution 50 mL (50 mLs Intravenous Given 01/03/24 1144)  iohexol (OMNIPAQUE) 350 MG/ML injection 75 mL (75 mLs Intravenous Contrast Given 01/03/24 1159)    ED Course/ Medical Decision Making/ A&P                                 Medical Decision Making Amount and/or Complexity of Data Reviewed Labs: ordered. Radiology: ordered.  Risk Prescription drug management.   73 year old female with past medical history of TIA, paroxysmal A-fib on Eliquis, and hypertension presenting for difficulty with word finding.  Patient is alert and oriented x 3, no acute distress, afebrile, stable vital signs.  No neurovascular deficits on exam.  No difficulty with word finding.  No slurred speech.  Patient able to identify objects speaking clearly.  Code stroke activated.  Patient does not qualify for tPA because she is on Eliquis and because all symptoms have resolved at this time. NIH stroke scale currently 0..  CT head demonstrates no acute process. CT angio head and neck demonstrates no acute process.  I spoke with our neurology team who has spoken with the patient regarding the findings.  Patient has shrapnel in her body and cannot receive an MRI.  All of her symptoms are resolved at this time.  She is already on Eliquis and covered for TIAs.  Cleared from a neurological standpoint.  Laboratory studies demonstrate stable findings.  Stable electrolytes.  Stable organ function. EKG demonstrates normal sinus rhythm.  Rate of 67 bpm.  No ST segment elevation or depression.  No T wave inversions.  Laboratory studies demonstrate stable electrolytes and renal function.  Patient did have very mild hypoglycemia with a POC glucose demonstrating 61 on arrival.  She was given D50 prior to going to CT.  UA is stable.  UDS is stable.  Loop recorder  interrogation demonstrated no acute events today.  Again patient's blood sugar was 60 on arrival.  Sitter hypoglycemia is possible trigger.Patient given the option to be admitted for observation versus discharge home and is requesting discharge home at this time.  She is going to follow-up with her primary care physician in the next 3 to 5 days.  She agrees to return to emergency department immediately for any worsening concerning signs or symptoms.        Final Clinical Impression(s) / ED Diagnoses Final diagnoses:  Aphasia    Rx / DC Orders ED Discharge Orders  None         Franne Forts, DO 01/03/24 1529

## 2024-01-03 NOTE — ED Notes (Signed)
Report rec'd from prev RN 

## 2024-01-03 NOTE — ED Notes (Signed)
Patient transported to CT with RN via stretcher.

## 2024-01-03 NOTE — Consult Note (Addendum)
[facility] Telemedicine Consult   Requesting Provider: Dr. Edwin Dada Consult Participants: Dr. Marthe Patch, Telespecialist RN Cathie Hoops   Bedside RN Candy Location of the provider: [facility] Location of the patient: [facility]   This consult was provided via telemedicine with 2-way video and audio communication. The patient/family was informed that care would be provided in this way and agreed to receive care in this manner.   Chief Complaint: Word finding difficulty HPI: 73 year old woman who has a past medical history of hypertension, atrial fibrillation on Eliquis presented to the emergency department when she had a sudden onset of difficulty speaking and getting her words out that lasted a few minutes.  Upon arrival to the ED, she felt that she is nearly back to baseline.  She also reported that she felt that her heart was racing during this event happening which she described as her heart being dancing. Last known well was somewhere around 11:05 AM. Has had a similar episode in the past which was deemed to be a TIA with negative MRI. She is unable to get an MRIdue to shrapnel in all her body    Past Medical History:  Diagnosis Date   Hypertension      Current Facility-Administered Medications:    dextrose 50 % solution, , , ,   Current Outpatient Medications:    alendronate (FOSAMAX) 70 MG tablet, Take 70 mg by mouth every Thursday., Disp: , Rfl:    amLODipine (NORVASC) 5 MG tablet, Take 5 mg by mouth daily., Disp: , Rfl:    apixaban (ELIQUIS) 5 MG TABS tablet, TAKE 1 TABLET BY MOUTH TWICE A DAY, Disp: 60 tablet, Rfl: 6   clindamycin-benzoyl peroxide (BENZACLIN) gel, Apply 1 Application topically as needed., Disp: , Rfl:    lisinopril (ZESTRIL) 20 MG tablet, Take 1 tablet (20 mg total) by mouth daily., Disp: , Rfl:    metoprolol succinate (TOPROL XL) 25 MG 24 hr tablet, Take 1 tablet (25 mg total) by mouth daily. Take with or immediately following a meal., Disp: 30 tablet, Rfl:  6   minocycline (MINOCIN) 50 MG capsule, Take 50 mg by mouth as needed (for rash/hives)., Disp: , Rfl:    rosuvastatin (CRESTOR) 20 MG tablet, Take 1 tablet (20 mg total) by mouth daily., Disp: 60 tablet, Rfl: 0    LKW: 11:05 AM IV thrombolysis given?: No, on Eliquis-last dose 7:15 AM IR Thrombectomy? No, symptoms resolved, no ELVO Modified Rankin Scale: 0-Completely asymptomatic and back to baseline post- stroke Time of teleneurologist evaluation: 11:39 AM  Exam: Vitals:   01/03/24 1140  BP: (!) 160/89  Pulse: 81  Temp: 98 F (36.7 C)  SpO2: 100%    General: Awake alert in no distress. Neurological exam Awake alert oriented x 3.  Speech is normal.  She thinks her fluency is still not back to baseline but on my examination over the camera I could not really pick up much deficit.  Naming repetition comprehension intact.  Cranial nerves II to XII intact.  No motor deficits.  No sensory deficits.  No coordination deficits   NIHSS 1A: Level of Consciousness - 0 1B: Ask Month and Age - 0 1C: 'Blink Eyes' & 'Squeeze Hands' - 0 2: Test Horizontal Extraocular Movements - 0 3: Test Visual Fields - 0 4: Test Facial Palsy - 0 5A: Test Left Arm Motor Drift - 0 5B: Test Right Arm Motor Drift - 0 6A: Test Left Leg Motor Drift - 0 6B: Test Right Leg Motor Drift -  00 7: Test Limb Ataxia - 0 8: Test Sensation - 0 9: Test Language/Aphasia- 0 10: Test Dysarthria - 0 11: Test Extinction/Inattention - 0 NIHSS score: 0   Imaging Reviewed: CT head code stroke-no acute changes.  CT angiography head and neck-no ELVO on my preliminary review.  Labs reviewed in epic and pertinent values follow: CBC    Component Value Date/Time   WBC 6.0 09/19/2023 1030   RBC 4.65 09/19/2023 1030   HGB 14.5 09/19/2023 1030   HCT 44.4 09/19/2023 1030   PLT 243 09/19/2023 1030   MCV 95.5 09/19/2023 1030   MCH 31.2 09/19/2023 1030   MCHC 32.7 09/19/2023 1030   RDW 12.5 09/19/2023 1030   LYMPHSABS 1.2  02/18/2023 1455   MONOABS 0.7 02/18/2023 1455   EOSABS 0.1 02/18/2023 1455   BASOSABS 0.0 02/18/2023 1455   CMP     Component Value Date/Time   NA 138 02/18/2023 1521   K 4.0 02/18/2023 1521   CL 103 02/18/2023 1521   CO2 22 02/18/2023 1455   GLUCOSE 118 (H) 02/18/2023 1521   BUN 18 02/18/2023 1521   CREATININE 0.90 02/18/2023 1521   CALCIUM 9.2 02/18/2023 1455   PROT 6.5 02/18/2023 1455   ALBUMIN 4.2 02/18/2023 1455   AST 21 02/18/2023 1455   ALT 15 02/18/2023 1455   ALKPHOS 51 02/18/2023 1455   BILITOT 0.6 02/18/2023 1455   GFRNONAA >60 02/18/2023 1455     Assessment:  73 year old with prior history of TIA with garbled speech deemed to be a left hemispheric TIA, atrial fibrillation on Eliquis presented for evaluation of garbled speech and word finding difficulty along with heart racing-symptoms nearly resolved at the time of presentation. On my examination NIH 0 Not a candidate for TNKase due to being on Eliquis Exam not consistent with LVO and imaging also negative for LVO.  Impression: Likely another TIA versus acute neurological event in the setting of A-fib with RVR.  Recommendations:  At this time, even if this is a TIA, I do not think there is much more of a workup needed as she is currently on Eliquis due to underlying A-fib. She cannot get an MRI due to shrapnel. I would recommend interrogating the pacemaker to see if there was any significant amount of RVR or other cardiac issue happening around the same time which can explain these transient neurological symptoms related to it. With her reassuring neurological exam, no further inpatient workup is recommended at this time Discussed this with the family and the patient at bedside. Discussed with Dr. Wallace Cullens Please call neurology with questions as needed   -- Milon Dikes, MD Neurologist [facility] Pager: [phone]

## 2024-01-03 NOTE — Telephone Encounter (Signed)
I returned pt's call from 22 minutes ago and left vm to call back.

## 2024-01-03 NOTE — ED Notes (Signed)
Fall risk armband Fall risk sign on door Patient wearing shoes

## 2024-01-03 NOTE — Discharge Instructions (Signed)
Your only abnormal lab test today was your glucose level on arrival was 60.  Was approximately 125.  We can hypothesized that maybe her symptoms were secondary to low blood sugar levels versus a TIA.  Your imaging results came back looking normal.  You cannot receive an MRI due to the shrapnel in your body.  You are already on medications to cover for TIAs.  Your loop recorder demonstrated no arrhythmias.  Your labs are otherwise very stable.  Please continue to hydrate yourself and eat regular meals today.  Follow closely with your primary care physician in the next 7 days and return to emergency department for any worsening symptoms.

## 2024-01-03 NOTE — ED Notes (Signed)
Patient transported to CT 

## 2024-01-03 NOTE — Telephone Encounter (Signed)
Patient is returning call.  °

## 2024-01-03 NOTE — ED Notes (Signed)
Tele neurology is speaking with pt-CT once his evaluation is completed

## 2024-01-03 NOTE — ED Triage Notes (Signed)
Pt report that she prior to arrival she was having "difficulty finding words".  She reports that her heart was pounding while this occurred.  Pt denies any other neuro deficits.  Hx of TIA.  Pt is able to speak and her speech is clear, she states that her speech is slower.  EDP to bedside and evaluating her.

## 2024-01-03 NOTE — ED Notes (Signed)
[employer] Rep called about interrogation fax, left msg with call service

## 2024-01-04 ENCOUNTER — Telehealth: Payer: Self-pay | Admitting: Cardiology

## 2024-01-04 ENCOUNTER — Telehealth: Payer: Self-pay

## 2024-01-04 ENCOUNTER — Other Ambulatory Visit (HOSPITAL_COMMUNITY)
Admission: RE | Admit: 2024-01-04 | Discharge: 2024-01-04 | Disposition: A | Discharge status: Home / Self Care | Payer: Self-pay | Source: Ambulatory Visit | Attending: Medical Genetics | Admitting: Medical Genetics

## 2024-01-04 NOTE — Telephone Encounter (Signed)
Called patient back and has been scheduled for telephone visit

## 2024-01-04 NOTE — Telephone Encounter (Signed)
     Pre-operative Risk Assessment    Patient Name: Cheryl Gallegos  DOB: 03/27/1951 MRN: [id number]   Date of last office visit: 12/11/23 Date of next office visit: 11/2024   Request for Surgical Clearance    Procedure:   Colonoscopy  Date of Surgery:  Clearance 04/28/24                                Surgeon:  Dr. Angelyn Punt Surgeon's Group or Practice Name:  [facility]  Phone number:  [phone] Fax number:  [phone]   Type of Clearance Requested:   - Medical  - Pharmacy:  Hold Apixaban (Eliquis) 48 hours   Type of Anesthesia:  MAC   Additional requests/questions:    Signed, Noe Gens   01/04/2024, 8:38 AM

## 2024-01-04 NOTE — Telephone Encounter (Signed)
Called patient back and has been scheduled for telephone visit and consent done     Patient Consent for Virtual Visit         Cheryl Gallegos has provided verbal consent on 01/04/2024 for a virtual visit (video or telephone).   CONSENT FOR VIRTUAL VISIT FOR:  Cheryl Gallegos  By participating in this virtual visit I agree to the following:  I hereby voluntarily request, consent and authorize [facility] and its employed or contracted physicians, physician assistants, nurse practitioners or other licensed health care professionals (the Practitioner), to provide me with telemedicine health care services (the "Services") as deemed necessary by the treating Practitioner. I acknowledge and consent to receive the Services by the Practitioner via telemedicine. I understand that the telemedicine visit will involve communicating with the Practitioner through live audiovisual communication technology and the disclosure of certain medical information by electronic transmission. I acknowledge that I have been given the opportunity to request an in-person assessment or other available alternative prior to the telemedicine visit and am voluntarily participating in the telemedicine visit.  I understand that I have the right to withhold or withdraw my consent to the use of telemedicine in the course of my care at any time, without affecting my right to future care or treatment, and that the Practitioner or I may terminate the telemedicine visit at any time. I understand that I have the right to inspect all information obtained and/or recorded in the course of the telemedicine visit and may receive copies of available information for a reasonable fee.  I understand that some of the potential risks of receiving the Services via telemedicine include:  Delay or interruption in medical evaluation due to technological equipment failure or disruption; Information transmitted may not be sufficient (e.g.  poor resolution of images) to allow for appropriate medical decision making by the Practitioner; and/or  In rare instances, security protocols could fail, causing a breach of personal health information.  Furthermore, I acknowledge that it is my responsibility to provide information about my medical history, conditions and care that is complete and accurate to the best of my ability. I acknowledge that Practitioner's advice, recommendations, and/or decision may be based on factors not within their control, such as incomplete or inaccurate data provided by me or distortions of diagnostic images or specimens that may result from electronic transmissions. I understand that the practice of medicine is not an exact science and that Practitioner makes no warranties or guarantees regarding treatment outcomes. I acknowledge that a copy of this consent can be made available to me via my patient portal [facility] MyChart), or I can request a printed copy by calling the office of [facility].    I understand that my insurance will be billed for this visit.   I have read or had this consent read to me. I understand the contents of this consent, which adequately explains the benefits and risks of the Services being provided via telemedicine.  I have been provided ample opportunity to ask questions regarding this consent and the Services and have had my questions answered to my satisfaction. I give my informed consent for the services to be provided through the use of telemedicine in my medical care

## 2024-01-04 NOTE — Telephone Encounter (Signed)
Patient is returning phone call. Patient stated she is having issues with her phone and requested we call her back at [phone]. Please advise.

## 2024-01-05 IMAGING — US US BREAST*L* LIMITED INC AXILLA
1 series · 9 of 9 positions shown · non-contrast
Comparison: Previous exam(s).

CLINICAL DATA: Palpable lump in the upper outer left breast.

EXAM:
DIGITAL DIAGNOSTIC UNILATERAL LEFT MAMMOGRAM WITH TOMOSYNTHESIS AND
CAD; ULTRASOUND LEFT BREAST LIMITED
TECHNIQUE: Left digital diagnostic mammography and breast tomosynthesis was
performed. The images were evaluated with computer-aided detection.;
Targeted ultrasound examination of the left breast was performed.

[Series 1: us breast*left* limited inc axilla · 0.06mm/px · 9 of 9 slices shown]
[im 1/9]
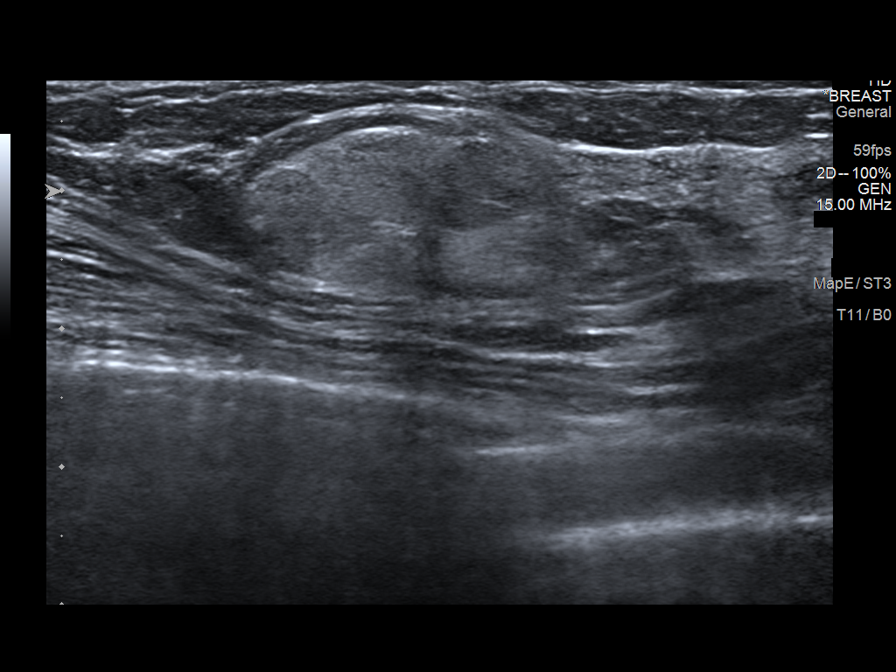
[im 2/9]
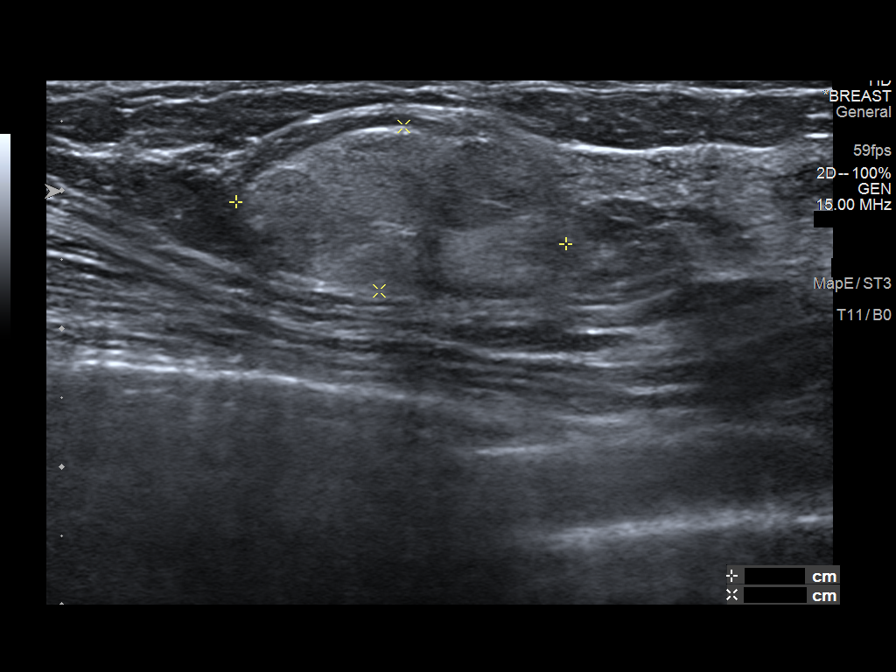
[im 3/9]
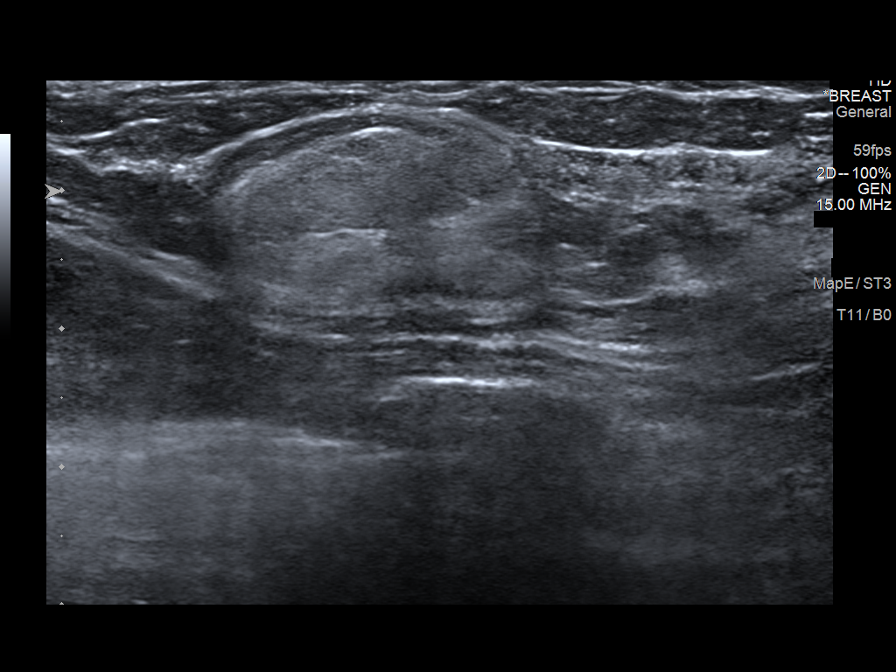
[im 4/9]
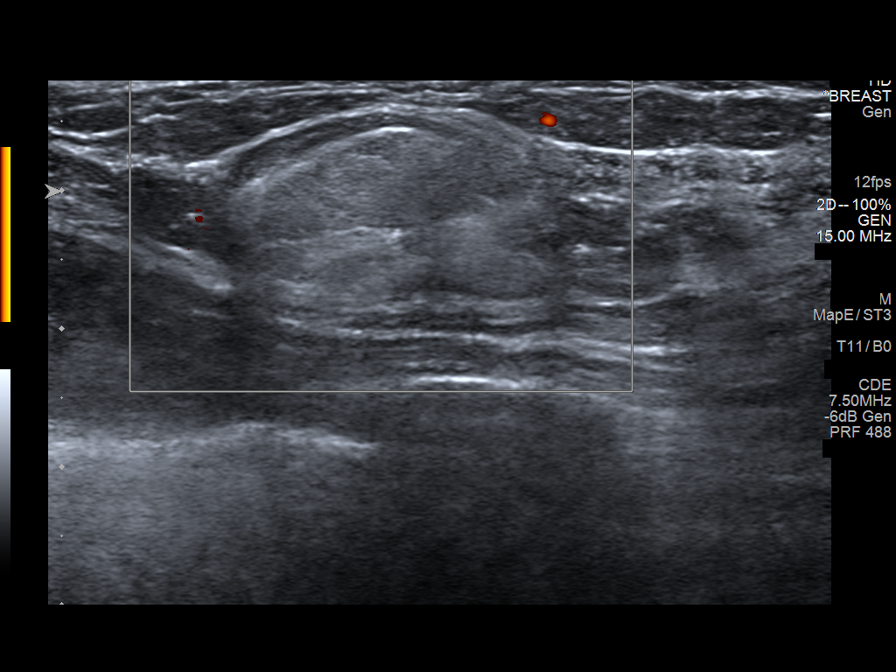
[im 5/9]
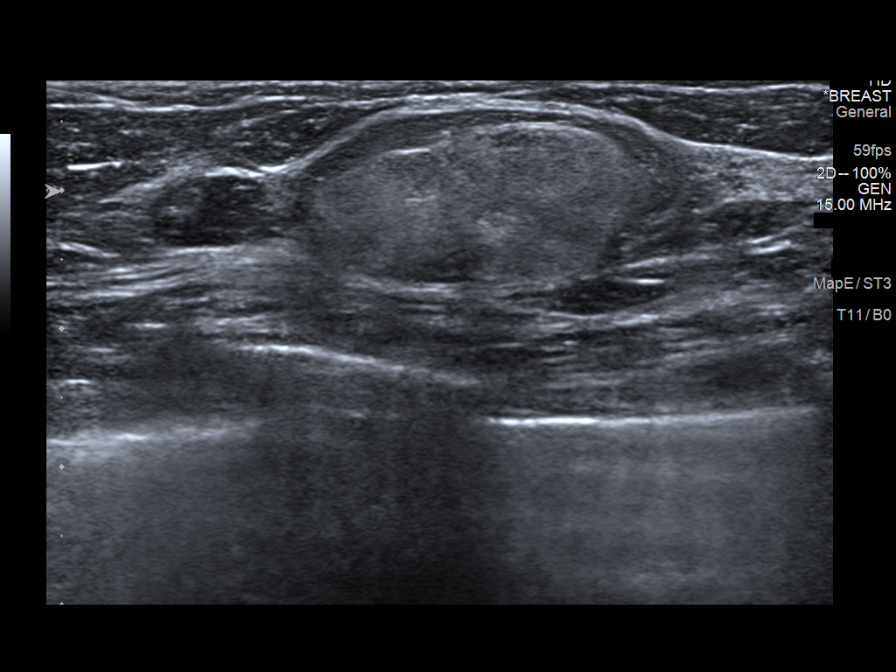
[im 6/9]
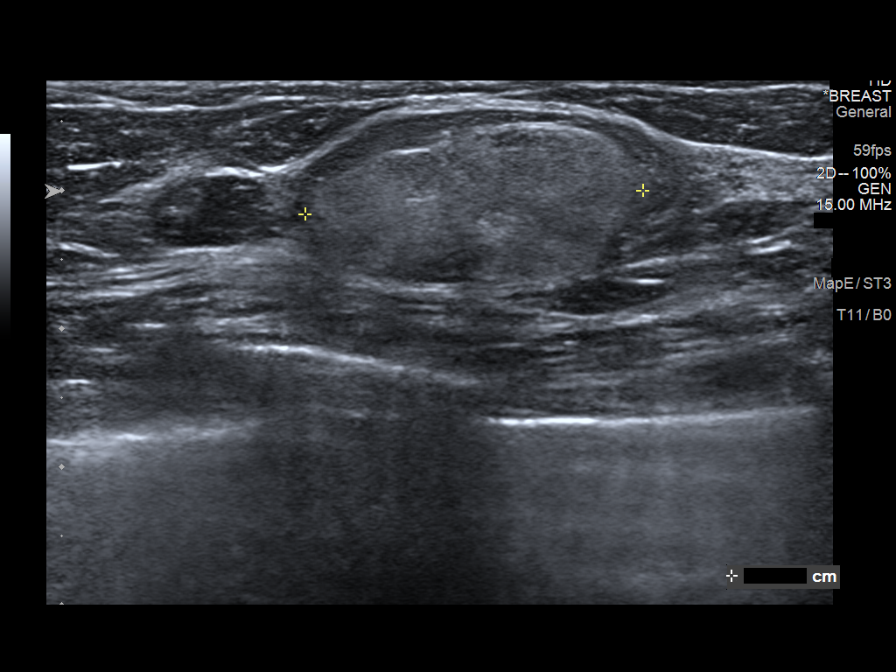
[im 7/9]
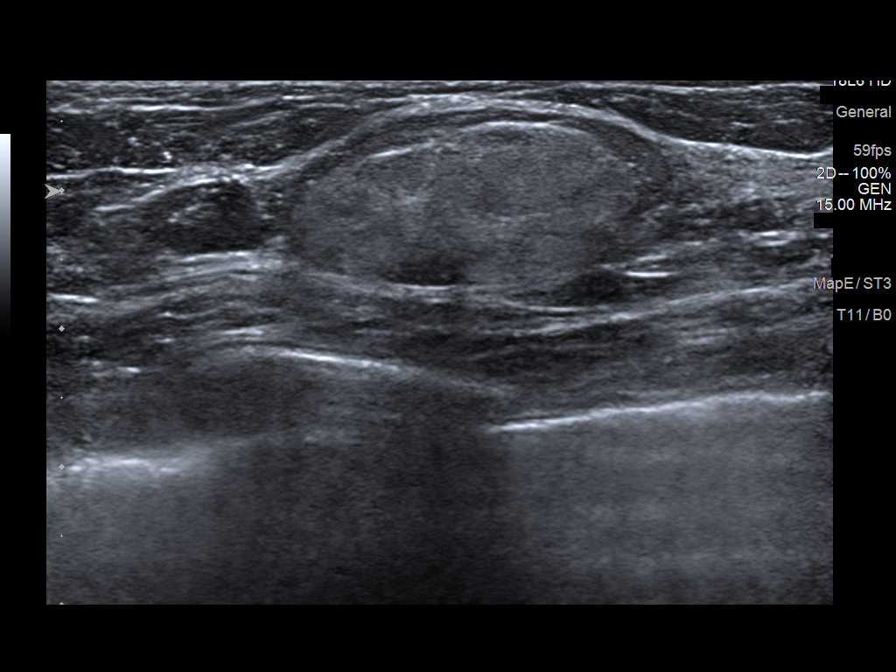
[im 8/9]
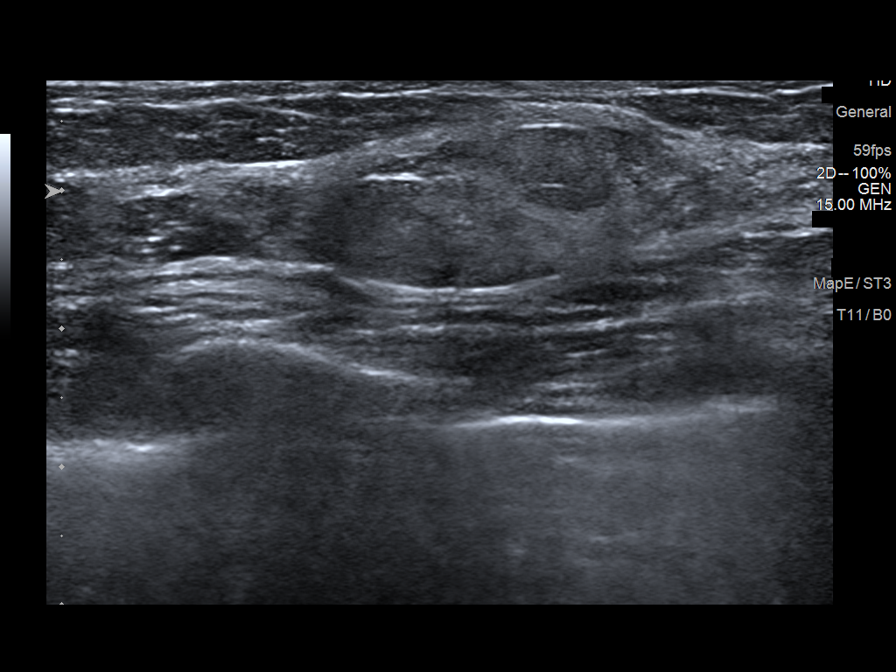
[im 9/9]
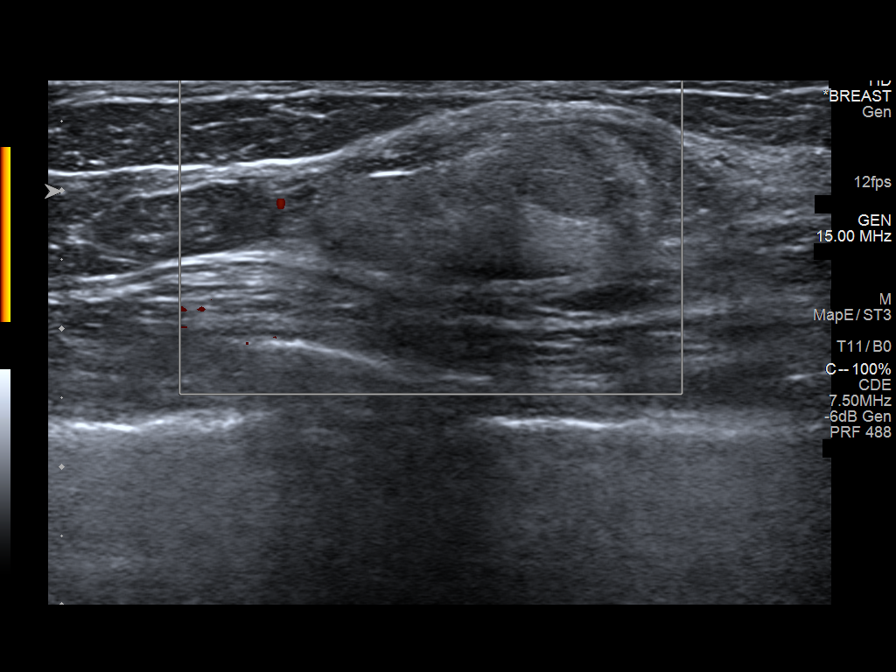

[9 of 9 positions shown; findings below may reference images not displayed]

ACR Breast Density Category c: The breast tissue is heterogeneously
dense, which may obscure small masses.
FINDINGS: In the upper outer left breast, there is a partly obscured, fat
attenuation oval mass with a thin capsule, unchanged from prior
mammograms. There are no other breast masses, no areas of
architectural distortion and no suspicious calcifications.

On physical exam, there is a smooth mobile mass in the upper outer
left breast.

Targeted ultrasound is performed, showing a hyperechoic oval mass in
the left breast at 1 o'clock, 6 cm the nipple, measuring 2.5 x 1.2 x
2.4 cm, correlating with the palpable abnormality and consistent
with a lipoma and the fat attenuation encapsulated mass seen
mammographically. No other abnormalities.
IMPRESSION: 1. No evidence of breast malignancy.
2. Benign left breast lipoma.

RECOMMENDATION:
1. Annual screening mammography. Last screening study performed on
07/11/2021.

I have discussed the findings and recommendations with the patient.
If applicable, a reminder letter will be sent to the patient
regarding the next appointment.

BI-RADS CATEGORY  2: Benign.

## 2024-01-05 IMAGING — MG MM DIGITAL DIAGNOSTIC UNILAT*L* W/ TOMO W/ CAD
6 series · 6 of 18 positions shown · non-contrast
Comparison: Previous exam(s).

CLINICAL DATA: Palpable lump in the upper outer left breast.

EXAM:
DIGITAL DIAGNOSTIC UNILATERAL LEFT MAMMOGRAM WITH TOMOSYNTHESIS AND
CAD; ULTRASOUND LEFT BREAST LIMITED
TECHNIQUE: Left digital diagnostic mammography and breast tomosynthesis was
performed. The images were evaluated with computer-aided detection.;
Targeted ultrasound examination of the left breast was performed.

[L MLO synth-2D (1 of 2)]
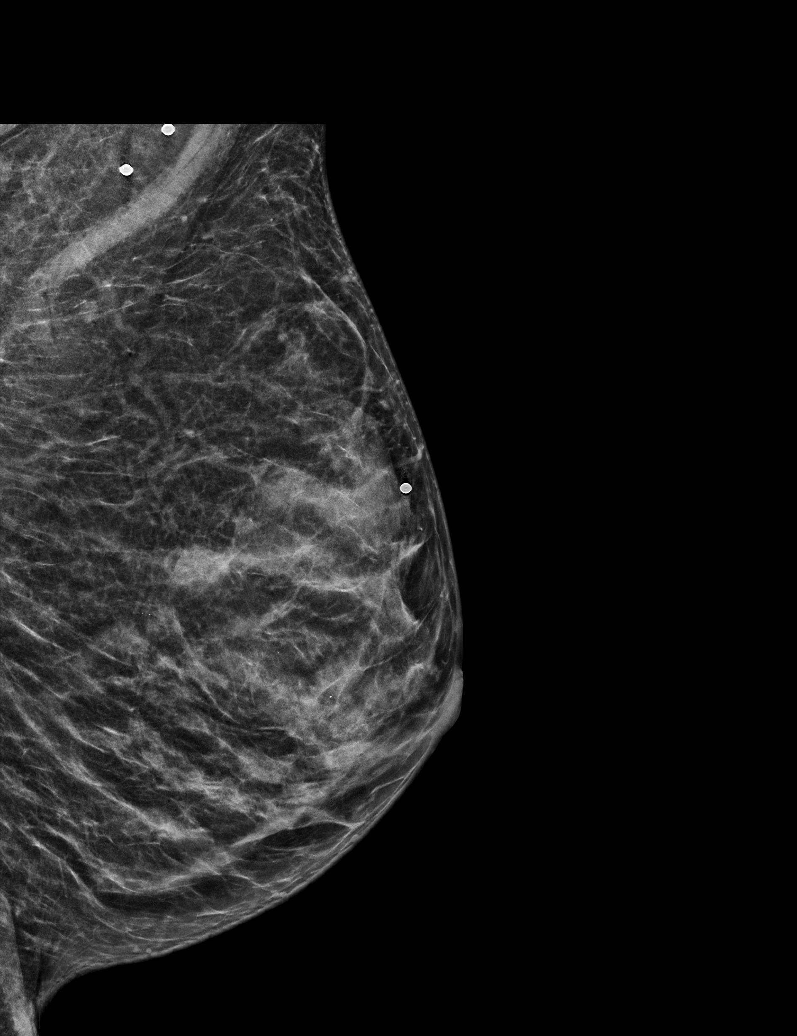

[L MLO synth-2D (2 of 2)]
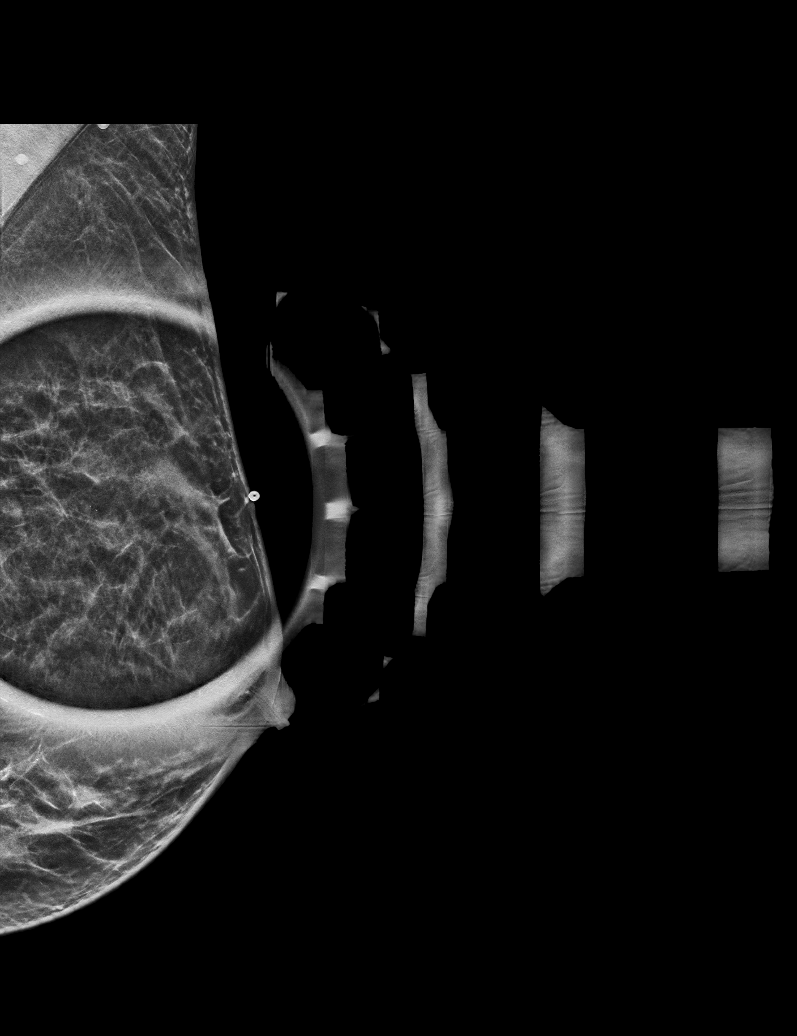

[L CC synth-2D]
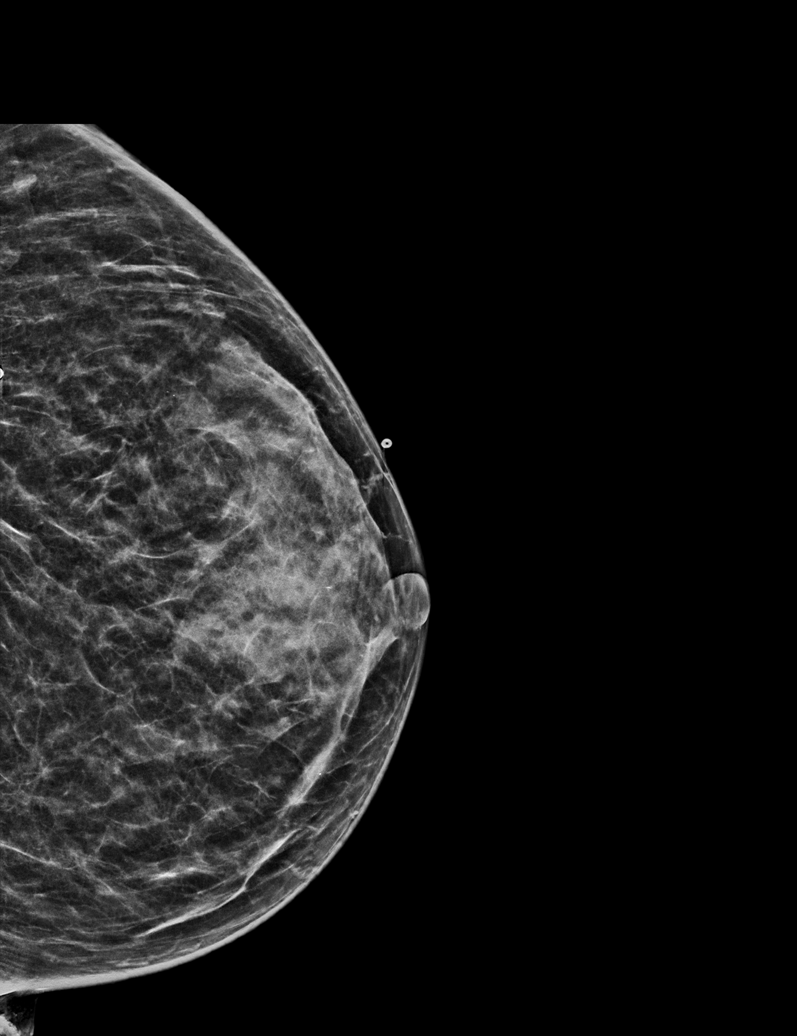

[L CC tomo · tomo slice 25/48.0]
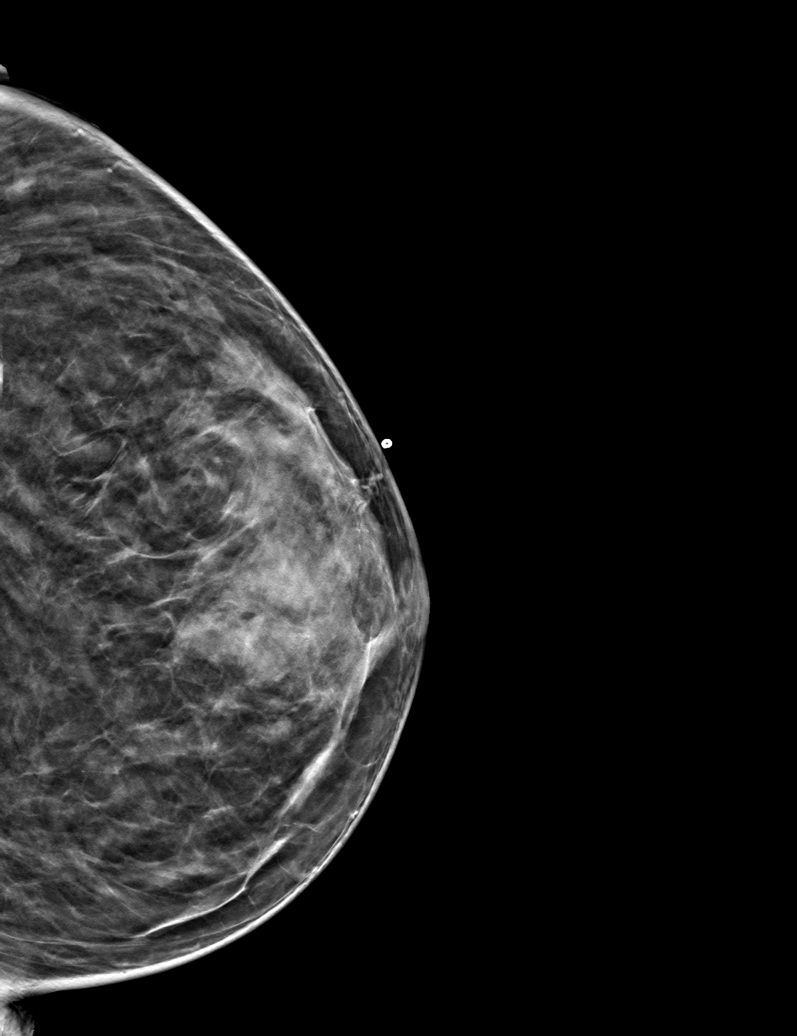

[L MLO tomo (1 of 2) · tomo slice 25/50.0]
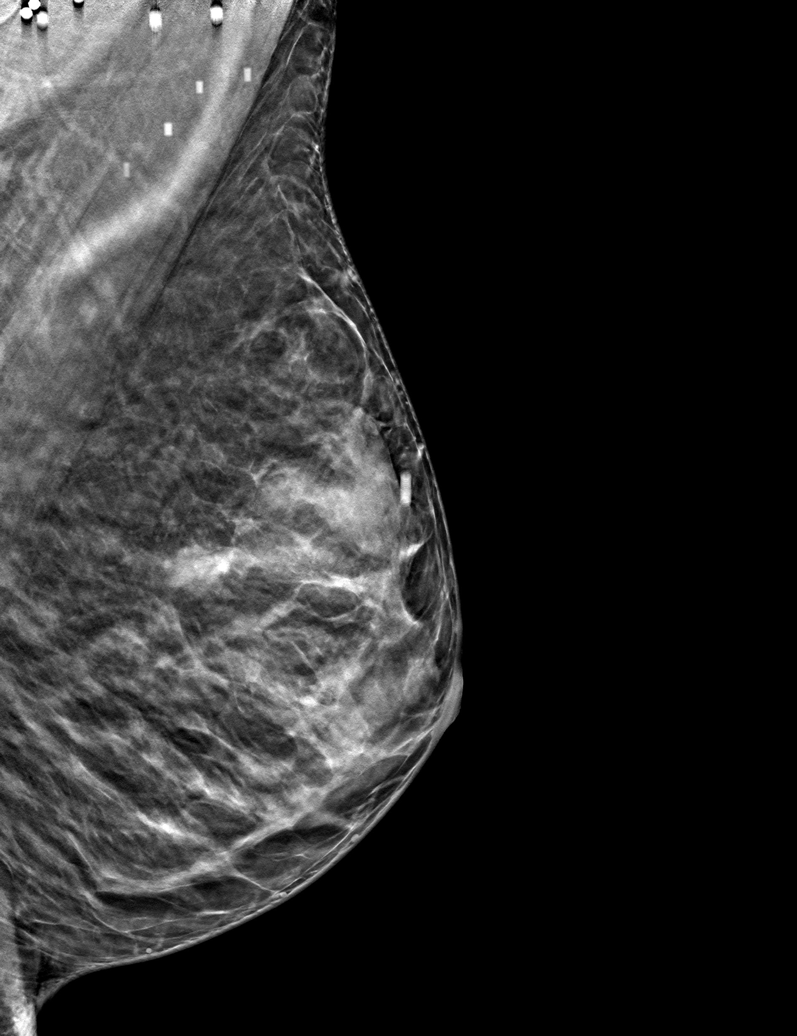

[L MLO tomo (2 of 2) · tomo slice 23/45.0]
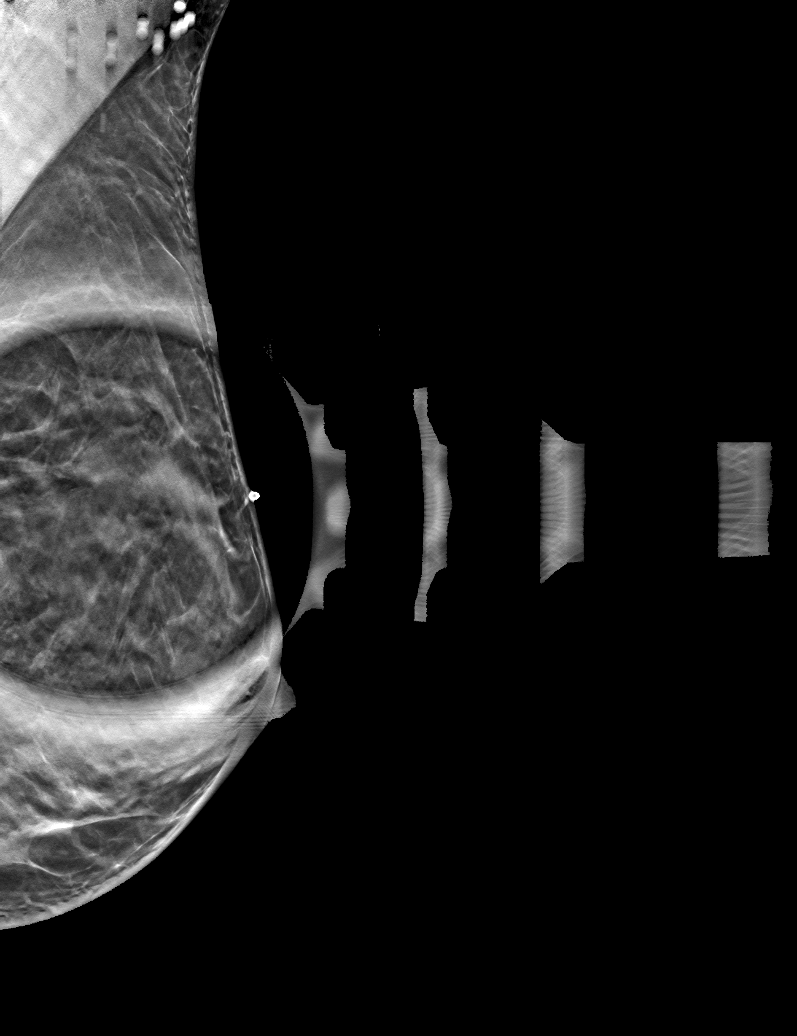

[6 of 18 positions shown; findings below may reference images not displayed]

ACR Breast Density Category c: The breast tissue is heterogeneously
dense, which may obscure small masses.
FINDINGS: In the upper outer left breast, there is a partly obscured, fat
attenuation oval mass with a thin capsule, unchanged from prior
mammograms. There are no other breast masses, no areas of
architectural distortion and no suspicious calcifications.

On physical exam, there is a smooth mobile mass in the upper outer
left breast.

Targeted ultrasound is performed, showing a hyperechoic oval mass in
the left breast at 1 o'clock, 6 cm the nipple, measuring 2.5 x 1.2 x
2.4 cm, correlating with the palpable abnormality and consistent
with a lipoma and the fat attenuation encapsulated mass seen
mammographically. No other abnormalities.
IMPRESSION: 1. No evidence of breast malignancy.
2. Benign left breast lipoma.

RECOMMENDATION:
1. Annual screening mammography. Last screening study performed on
07/11/2021.

I have discussed the findings and recommendations with the patient.
If applicable, a reminder letter will be sent to the patient
regarding the next appointment.

BI-RADS CATEGORY  2: Benign.

## 2024-01-08 ENCOUNTER — Encounter: Payer: Self-pay | Admitting: Cardiology

## 2024-01-08 NOTE — Telephone Encounter (Signed)
Patient called to report she had an ER visit last Thursday and the provider suggested the following:  Due to recent neurological event the provider thinks patient should do the procedure with Eliquis on board.  Contract information for provider - Dr. Dustin Folks, [facility] Sprang at [place], ph# [phone].  Patient stated they will be doing a needle biopsy and there will be no cutting as the mass is close to the skin.

## 2024-01-08 NOTE — Telephone Encounter (Signed)
Combined with clearance 01/15/24 note. Will delete this phone note. Tereso Newcomer, PA-C    01/08/2024 12:10 PM   This encounter was created in error - please disregard.

## 2024-01-08 NOTE — Telephone Encounter (Signed)
Received separate telephone encounter with the following information:  Patient called to report she had an ER visit last Thursday (01/03/24) and the provider suggested the following:   Due to recent neurological event the provider thinks patient should do the procedure with Eliquis on board.  Contract information for provider - Dr. Dustin Folks, [facility] Sprang at [place], ph# [phone].  Patient stated they will be doing a needle biopsy and there will be no cutting as the mass is close to the skin.  I will delete the duplicate phone note and leave this note in preop pool for APP that will do virtual visit this week.  Tereso Newcomer, PA-C    01/08/2024 12:08 PM

## 2024-01-08 NOTE — Telephone Encounter (Signed)
I will forward this to pre op APP to review.

## 2024-01-10 ENCOUNTER — Ambulatory Visit: Payer: Medicare PPO | Attending: Cardiology

## 2024-01-10 DIAGNOSIS — Z01810 Encounter for preprocedural cardiovascular examination: Secondary | ICD-10-CM

## 2024-01-10 NOTE — Progress Notes (Signed)
Virtual Visit via Telephone Note   Because of Cheryl Gallegos's co-morbid illnesses, she is at least at moderate risk for complications without adequate follow up.  This format is felt to be most appropriate for this patient at this time.  The patient did not have access to video technology/had technical difficulties with video requiring transitioning to audio format only (telephone).  All issues noted in this document were discussed and addressed.  No physical exam could be performed with this format.  Please refer to the patient's chart for her consent to telehealth for [facility].  Evaluation Performed:  Preoperative cardiovascular risk assessment _____________   Date:  01/10/2024   Patient ID:  Cheryl Gallegos, DOB [birth year], MRN [id number] Patient Location:  Home Provider location:   Office  Primary Care Provider:  Shon Hale, MD Primary Cardiologist:  None  Chief Complaint / Patient Profile   73 y.o. y/o female with a h/o TIA, HTN, HLD, paroxysmal atrial fibrillation who is pending Ultrasound Core Biopsy Soft Tissue  and presents today for telephonic preoperative cardiovascular risk assessment.  History of Present Illness    Cheryl Gallegos is a 73 y.o. female who presents via audio/video conferencing for a telehealth visit today.  Pt was last seen in cardiology clinic on 05/02/2023 by Dr. Elberta Fortis.  At that time CAILEE BLANKE was doing well .  The patient is now pending procedure as outlined above. Since her last visit, she has had a neurological event but remained stable from a cardiac standpoint.  Provider feels that she should stay on Eliquis throughout her upcoming procedure.  Today she denies chest pain, shortness of breath, lower extremity edema, fatigue, palpitations, melena, hematuria, hemoptysis, diaphoresis, weakness, presyncope, syncope, orthopnea, and PND.   Past Medical History    Past Medical History:  Diagnosis Date    Hypertension    Past Surgical History:  Procedure Laterality Date   ABDOMINAL HYSTERECTOMY     APPENDECTOMY     BREAST BIOPSY Left 08/07/2023   MM LT BREAST BX W LOC DEV 1ST LESION IMAGE BX SPEC STEREO GUIDE 08/07/2023 [facility] MAMMOGR[facility]   BREAST EXCISIONAL BIOPSY Left 1975   benign    Allergies  Allergies  Allergen Reactions   Beta Adrenergic Blockers Other (See Comments)    Orthostatic hypotension   Ibuprofen Swelling   Tomato Hives   Chocolate Swelling and Rash    Home Medications    Prior to Admission medications   Medication Sig Start Date End Date Taking? Authorizing Provider  alendronate (FOSAMAX) 70 MG tablet Take 70 mg by mouth every Thursday.    [provider]  amLODipine (NORVASC) 5 MG tablet Take 5 mg by mouth daily.    [provider]  apixaban (ELIQUIS) 5 MG TABS tablet TAKE 1 TABLET BY MOUTH TWICE A DAY 11/28/23   Eustace Pen, PA-C  clindamycin-benzoyl peroxide (BENZACLIN) gel Apply 1 Application topically as needed. 12/11/23   [provider]  lisinopril (ZESTRIL) 20 MG tablet Take 1 tablet (20 mg total) by mouth daily. 02/22/23   Rolly Salter, MD  metoprolol succinate (TOPROL XL) 25 MG 24 hr tablet Take 1 tablet (25 mg total) by mouth daily. Take with or immediately following a meal. 12/18/23   Eustace Pen, PA-C  minocycline (MINOCIN) 50 MG capsule Take 50 mg by mouth as needed (for rash/hives). 02/03/23   [provider]  rosuvastatin (CRESTOR) 20 MG tablet Take 1 tablet (20 mg total) by  mouth daily. 02/20/23   Rolly Salter, MD    Physical Exam    Vital Signs:  PARUL PORCELLI does not have vital signs available for review today.  Given telephonic nature of communication, physical exam is limited. AAOx3. NAD. Normal affect.  Speech and respirations are unlabored.  Accessory Clinical Findings    None  Assessment & Plan    1.  Preoperative Cardiovascular Risk Assessment:Procedure:   Ultrasound Core  Biopsy Soft Tissue   Date of Surgery:  Clearance 01/15/24                                  Surgeon:  Not indicated Surgeon's Group or Practice Name:  [facility] Radiology Department Phone number:  [phone] Fax number:  [phone]      Primary Cardiologist: Dr. Elberta Fortis  Chart reviewed as part of pre-operative protocol coverage. Given past medical history and time since last visit, based on ACC/AHA guidelines, MAHAYLA HADDAWAY would be at acceptable risk for the planned procedure without further cardiovascular testing.   Patient called to report she had an ER visit last Thursday (01/03/24) and the provider suggested the following:   Due to recent neurological event the provider thinks patient should do the procedure with Eliquis on board.  Contract information for provider - Dr. Dustin Folks, [facility] Sprang at [place], ph# [phone].  Patient stated they will be doing a needle biopsy and there will be no cutting as the mass is close to the skin.   Her RCRI is moderate risk, 6.6% risk of major cardiac event.  She is able to complete greater than 4 METS of physical activity.  Patient was advised that if she develops new symptoms prior to surgery to contact our office to arrange a follow-up appointment.  He verbalized understanding.  I will route this recommendation to the requesting party via Epic fax function and remove from pre-op pool.       Time:   Today, I have spent 7 minutes with the patient with telehealth technology discussing medical history, symptoms, and management plan.     Ronney Asters, NP  01/10/2024, 7:03 AM

## 2024-01-14 ENCOUNTER — Other Ambulatory Visit: Payer: Self-pay | Admitting: Radiology

## 2024-01-14 DIAGNOSIS — R2242 Localized swelling, mass and lump, left lower limb: Secondary | ICD-10-CM

## 2024-01-14 LAB — GENECONNECT MOLECULAR SCREEN: Genetic Analysis Overall Interpretation: NEGATIVE

## 2024-01-15 ENCOUNTER — Other Ambulatory Visit: Payer: Self-pay

## 2024-01-15 ENCOUNTER — Ambulatory Visit (HOSPITAL_COMMUNITY)
Admission: RE | Admit: 2024-01-15 | Discharge: 2024-01-15 | Disposition: A | Discharge status: Home / Self Care | Payer: Medicare PPO | Source: Ambulatory Visit | Attending: Family Medicine | Admitting: Family Medicine

## 2024-01-15 ENCOUNTER — Encounter (HOSPITAL_COMMUNITY): Payer: Self-pay

## 2024-01-15 DIAGNOSIS — Z9889 Other specified postprocedural states: Secondary | ICD-10-CM | POA: Insufficient documentation

## 2024-01-15 DIAGNOSIS — R2242 Localized swelling, mass and lump, left lower limb: Secondary | ICD-10-CM | POA: Diagnosis present

## 2024-01-15 DIAGNOSIS — D1724 Benign lipomatous neoplasm of skin and subcutaneous tissue of left leg: Secondary | ICD-10-CM | POA: Insufficient documentation

## 2024-01-15 LAB — CBC
HCT: 43.6 % (ref 36.0–46.0)
Hemoglobin: 14.8 g/dL (ref 12.0–15.0)
MCH: 32 pg (ref 26.0–34.0)
MCHC: 33.9 g/dL (ref 30.0–36.0)
MCV: 94.2 fL (ref 80.0–100.0)
Platelets: 239 10*3/uL (ref 150–400)
RBC: 4.63 MIL/uL (ref 3.87–5.11)
RDW: 12.7 % (ref 11.5–15.5)
WBC: 5.8 10*3/uL (ref 4.0–10.5)
nRBC: 0 % (ref 0.0–0.2)

## 2024-01-15 LAB — PROTIME-INR
INR: 1.1 (ref 0.8–1.2)
Prothrombin Time: 14.4 s (ref 11.4–15.2)

## 2024-01-15 MED ORDER — FENTANYL CITRATE (PF) 100 MCG/2ML IJ SOLN
INTRAMUSCULAR | Status: AC
  Filled 2024-01-15: qty 2

## 2024-01-15 MED ORDER — FENTANYL CITRATE (PF) 100 MCG/2ML IJ SOLN
INTRAMUSCULAR | Status: AC | PRN
  Administered 2024-01-15: 50 ug via INTRAVENOUS

## 2024-01-15 MED ORDER — LIDOCAINE HCL 1 % IJ SOLN: 10.0000 mL | Freq: Once | INTRAMUSCULAR | Status: DC

## 2024-01-15 MED ORDER — SODIUM CHLORIDE 0.9 % IV SOLN: INTRAVENOUS | Status: DC

## 2024-01-15 MED ORDER — MIDAZOLAM HCL 2 MG/2ML IJ SOLN
INTRAMUSCULAR | Status: AC | PRN
  Administered 2024-01-15: 1 mg via INTRAVENOUS

## 2024-01-15 MED ORDER — MIDAZOLAM HCL 2 MG/2ML IJ SOLN
INTRAMUSCULAR | Status: AC
  Filled 2024-01-15: qty 2

## 2024-01-15 NOTE — Progress Notes (Signed)
 Patient and husband was given discharge instructions. Both verbalized understanding.

## 2024-01-15 NOTE — Procedures (Signed)
 Interventional Radiology Procedure Note  Procedure: Ultrasound guided soft tissue mass biopsy   Findings: Please refer to procedural dictation for full description.18 ga core from left lateral thigh soft tissue mass x3.    Complications: None immediate  Estimated Blood Loss: < 5 mL  Recommendations: 1 hour bedrest Follow Pathology results   Ester Sides, MD

## 2024-01-15 NOTE — H&P (Addendum)
 Chief Complaint: Patient was seen in consultation today for left hip mass-- for biopsy at the request of Gallegos,Cheryl S  Referring Physician(s): Gallegos,Cheryl S  Supervising Physician: Cheryl Gallegos  Patient Status: [facility] - Out-pt  History of Present Illness: Cheryl Gallegos is a 73 y.o. female   FULL Code status per pt  Pt felt this small nodule like finding in November 2024 while putting on lotion It has grown since Not painful No cancer hx  US  12/24/22: IMPRESSION: 1. Left lateral hip hypoechoic, avascular lesion versus a cluster of lesions. This does not have the typical ultrasound appearance of lipoma. Tissue sampling recommended.  Dr Chrystal has requested biopsy  Takes Eliquis  daily--- has had recent TIA event Was seen in ED 1/23 for same She has NOT stopped Eliquis --- last dose last pm  Past Medical History:  Diagnosis Date   Hypertension     Past Surgical History:  Procedure Laterality Date   ABDOMINAL HYSTERECTOMY     APPENDECTOMY     BREAST BIOPSY Left 08/07/2023   MM LT BREAST BX W LOC DEV 1ST LESION IMAGE BX SPEC STEREO GUIDE 08/07/2023 [facility]   BREAST EXCISIONAL BIOPSY Left 1975   benign    Allergies: Beta adrenergic blockers, Ibuprofen, Tomato, and Chocolate  Medications: Prior to Admission medications   Medication Sig Start Date End Date Taking? Authorizing Provider  alendronate (FOSAMAX) 70 MG tablet Take 70 mg by mouth every Thursday.    [provider]  amLODipine (NORVASC) 5 MG tablet Take 5 mg by mouth daily.    [provider]  apixaban  (ELIQUIS ) 5 MG TABS tablet TAKE 1 TABLET BY MOUTH TWICE A DAY 11/28/23   Terra Fairy PARAS, PA-C  clindamycin-benzoyl peroxide (BENZACLIN) gel Apply 1 Application topically as needed. 12/11/23   [provider]  lisinopril  (ZESTRIL ) 20 MG tablet Take 1 tablet (20 mg total) by mouth daily. 02/22/23   Tobie Yetta HERO, MD  metoprolol  succinate (TOPROL  XL)  25 MG 24 hr tablet Take 1 tablet (25 mg total) by mouth daily. Take with or immediately following a meal. 12/18/23   Terra Fairy PARAS, PA-C  minocycline (MINOCIN) 50 MG capsule Take 50 mg by mouth as needed (for rash/hives). 02/03/23   [provider]  rosuvastatin  (CRESTOR ) 20 MG tablet Take 1 tablet (20 mg total) by mouth daily. 02/20/23   Tobie Yetta HERO, MD     Family History  Problem Relation Age of Onset   Bone cancer Mother    Aneurysm Father    Hypertension Brother     Social History   Socioeconomic History   Marital status: Married    Spouse name: Not on file   Number of children: Not on file   Years of education: Not on file   Highest education level: Not on file  Occupational History   Not on file  Tobacco Use   Smoking status: Never   Smokeless tobacco: Never   Tobacco comments:    Never smoked 08/02/23  Substance and Sexual Activity   Alcohol use: Yes    Alcohol/week: 6.0 standard drinks of alcohol    Types: 6 Glasses of wine per week    Comment: 1 glass nightly 08/02/23   Drug use: Never   Sexual activity: Not on file  Other Topics Concern   Not on file  Social History Narrative   Not on file   Social Drivers of Health   Financial Resource Strain: Not on file  Food Insecurity:  Not on file  Transportation Needs: Not on file  Physical Activity: Not on file  Stress: Not on file  Social Connections: Not on file    Review of Systems: A 12 point ROS discussed and pertinent positives are indicated in the HPI above.  All other systems are negative.  Review of Systems  Constitutional:  Negative for activity change, fatigue and fever.  Respiratory:  Negative for cough and shortness of breath.   Cardiovascular:  Negative for chest pain.  Gastrointestinal:  Negative for abdominal pain, nausea and vomiting.  Musculoskeletal:  Negative for back pain and gait problem.  Psychiatric/Behavioral:  Negative for behavioral problems and confusion.     Vital  Signs: BP (!) 166/84   Temp 98.1 F (36.7 C) (Oral)   Resp 14   Ht 5' 10 (1.778 m)   Wt 136 lb (61.7 kg)   SpO2 100%   BMI 19.51 kg/m   Advance Care Plan: The advanced care plan/surrogate decision maker was discussed at the time of visit and documented in the medical record.    Physical Exam Vitals reviewed.  HENT:     Mouth/Throat:     Mouth: Mucous membranes are moist.  Cardiovascular:     Rate and Rhythm: Normal rate and regular rhythm.     Heart sounds: Normal heart sounds. No murmur heard. Pulmonary:     Effort: Pulmonary effort is normal.     Breath sounds: Normal breath sounds. No wheezing.  Abdominal:     Palpations: Abdomen is soft.     Tenderness: There is no abdominal tenderness.  Musculoskeletal:        General: No swelling or tenderness. Normal range of motion.     Comments: Left hip nodule Firm to touch Palpable and visible  Neurological:     Mental Status: She is alert.     Imaging: CT ANGIO HEAD NECK W WO CM (CODE STROKE) Result Date: 01/03/2024 CLINICAL DATA:  Neuro deficit. Stroke suspected. Word-finding difficulty. EXAM: CT ANGIOGRAPHY HEAD AND NECK WITH AND WITHOUT CONTRAST TECHNIQUE: Multidetector CT imaging of the head and neck was performed using the standard protocol during bolus administration of intravenous contrast. Multiplanar CT image reconstructions and MIPs were obtained to evaluate the vascular anatomy. Carotid stenosis measurements (when applicable) are obtained utilizing NASCET criteria, using the distal internal carotid diameter as the denominator. RADIATION DOSE REDUCTION: This exam was performed according to the departmental dose-optimization program which includes automated exposure control, adjustment of the mA and/or kV according to patient size and/or use of iterative reconstruction technique. CONTRAST:  75mL OMNIPAQUE  IOHEXOL  350 MG/ML SOLN COMPARISON:  CT head without contrast 01/03/2024. CT angio head and neck 02/19/2023. FINDINGS:  CTA NECK FINDINGS Aortic arch: A 4 vessel arch configuration is present. Left vertebral artery originates directly from the aortic arch, a normal variant. A near common origin of the left common carotid artery in the innominate artery is noted. Minimal atherosclerotic calcifications are present on the undersurface of the aortic arch. No aneurysm or stenosis is present. No dissection is present. Right carotid system: The right common carotid artery is within normal limits. Bifurcation is unremarkable. Mild tortuosity is present cervical right ICA without focal stenosis. Left carotid system: The left common carotid artery is within normal limits. Bifurcation is unremarkable. Mild tortuosity is present cervical left ICA without focal stenosis. Vertebral arteries: The right vertebral artery is the dominant vessel. Right vertebral artery originates from the subclavian artery without significant stenosis. No significant stenosis is present in  either vertebral artery in the neck. Skeleton: Multilevel degenerative changes are present cervical spine. Chronic endplate changes are present at C4-5, C5-6 and C6-7. Mild straightening of the normal cervical lordosis is present. No focal osseous lesions are present. Other neck: Soft tissues the neck are otherwise unremarkable. Salivary glands are within normal limits. Thyroid  is normal. No significant adenopathy is present. No focal mucosal or submucosal lesions are present. Upper chest: Minimal atelectasis is present in the superior segments of the lower lobes bilaterally. No nodule or mass lesion is present. No pleural effusion or pneumothorax is present. Multiple metallic densities project over the left upper chest. Implanted loop recorder is noted. Review of the MIP images confirms the above findings CTA HEAD FINDINGS Anterior circulation: Atherosclerotic calcifications are present within the cavernous internal carotid arteries bilaterally without focal stenosis through the ICA  terminus. The aorta scratched at the left A1 segment is dominant. The anterior communicating artery is patent. Both A2 segments fill predominantly from the left. The M1 segments are normal. ACA and MCA branch vessels are within normal limits bilaterally. Posterior circulation: The PICA origins are visualized and normal bilaterally. The vertebrobasilar junction and basilar artery are normal. Both posterior cerebral arteries originate from the basilar tip. The anterior cerebral arteries are patent bilaterally. PCA branch vessels are within normal limits bilaterally. Venous sinuses: The dural sinuses are patent. The straight sinus and deep cerebral veins are intact. Cortical veins are within normal limits. No significant vascular malformation is evident. Anatomic variants: None Review of the MIP images confirms the above findings IMPRESSION: 1. No emergent large vessel occlusion. 2. Mild tortuosity of the cervical internal carotid arteries bilaterally without focal stenosis. This likely reflects chronic hypertension. 3. Multilevel degenerative changes of the cervical spine. 4.  Aortic Atherosclerosis (ICD10-I70.0). Electronically Signed   By: Lonni Necessary M.D.   On: 01/03/2024 12:20   CT HEAD CODE STROKE WO CONTRAST Result Date: 01/03/2024 CLINICAL DATA:  Code stroke. Neuro deficit, acute, stroke suspected. Word-finding difficulty. EXAM: CT HEAD WITHOUT CONTRAST TECHNIQUE: Contiguous axial images were obtained from the base of the skull through the vertex without intravenous contrast. RADIATION DOSE REDUCTION: This exam was performed according to the departmental dose-optimization program which includes automated exposure control, adjustment of the mA and/or kV according to patient size and/or use of iterative reconstruction technique. COMPARISON:  CT head 02/18/2023. FINDINGS: Brain: Mild atrophy and white matter changes are stable. No acute infarct, hemorrhage, or mass lesion is present. Deep brain nuclei  are within normal limits. The ventricles are of normal size. No significant extraaxial fluid collection is present. The brainstem and cerebellum are within normal limits. Midline structures are within normal limits. Vascular: Atherosclerotic calcifications are present within the cavernous internal carotid arteries. No hyperdense vessel is present. Skull: Calvarium is intact. No focal lytic or blastic lesions are present. No significant extracranial soft tissue lesion is present. Sinuses/Orbits: Minimal mucosal thickening is present in the inferior left maxillary sinus. No fluid levels are present. The paranasal sinuses and mastoid air cells are otherwise clear. The globes and orbits are within normal limits. ASPECTS [facility] Stroke Program Early CT Score) - Ganglionic level infarction (caudate, lentiform nuclei, internal capsule, insula, M1-M3 cortex): 7/7 - Supraganglionic infarction (M4-M6 cortex): 3/3 Total score (0-10 with 10 being normal): 10/10 IMPRESSION: 1. No acute intracranial abnormality or significant interval change. 2. Stable atrophy and white matter disease. This likely reflects the sequela of chronic microvascular ischemia. 3. Aspects is 10/10. Electronically Signed   By: Lonni  Mattern M.D.   On: 01/03/2024 12:12   CUP PACEART REMOTE DEVICE CHECK Result Date: 12/31/2023 ILR summary report received. Battery status OK. Normal device function. No new symptom, brady, or pause episodes. 5 new AF episodes, longest duration 8hrs , poor rate control, burden 1.3%, Eliquis  per PA report. Monthly summary reports and ROV/PRN 1 tachy event, 5sec in duration, HR 188, this has been seen in the past [place], CVRS  US  LT LOWER EXTREM LTD SOFT TISSUE NON VASCULAR Result Date: 12/26/2023 : PROCEDURE: LEFT LOWER EXTREMITY SOFT TISSUE ULTRASOUND LIMITED HISTORY: Patient is a 73 y/o F with eval for lt lateral hip lipoma vs mass. COMPARISON: None. TECHNIQUE: Two-dimensional grayscale and color Doppler  ultrasound of the left lateral hip was performed. FINDINGS: There is a 2.0 x 1.0 x 1.4 cm hypoechoic, avascular lesion versus a cluster of lesions identified within the left lateral hip, area of concern. No focal fluid collections are demonstrated. IMPRESSION: 1. Left lateral hip hypoechoic, avascular lesion versus a cluster of lesions. This does not have the typical ultrasound appearance of lipoma. Tissue sampling recommended. Thank you for allowing us  to assist in the care of this patient. Electronically Signed   By: Lynwood Mains M.D.   On: 12/26/2023 19:40    Labs:  CBC: Recent Labs    02/18/23 1455 02/18/23 1521 09/19/23 1030 01/03/24 1143  WBC 8.7  --  6.0 7.1  HGB 14.3 14.3 14.5 14.3  HCT 42.0 42.0 44.4 42.1  PLT 264  --  243 213    COAGS: Recent Labs    02/18/23 1455 01/03/24 1143  INR 1.0 1.1  APTT 27 31    BMP: Recent Labs    02/18/23 1455 02/18/23 1521 01/03/24 1143  NA 136 138 135  K 4.0 4.0 3.9  CL 105 103 100  CO2 22  --  24  GLUCOSE 121* 118* 101*  BUN 16 18 12   CALCIUM  9.2  --  9.4  CREATININE 0.89 0.90 0.71  GFRNONAA >60  --  >60    LIVER FUNCTION TESTS: Recent Labs    02/18/23 1455 01/03/24 1143  BILITOT 0.6 0.8  AST 21 26  ALT 15 27  ALKPHOS 51 50  PROT 6.5 7.3  ALBUMIN 4.2 4.8    TUMOR MARKERS: No results for input(s): AFPTM, CEA, CA199, CHROMGRNA in the last 8760 hours.  Assessment and Plan:  Scheduled for left hip mass biopsy Risks and benefits of left hip mass biopsy was discussed with the patient and/or patient's family including, but not limited to bleeding, infection, damage to adjacent structures or low yield requiring additional tests.  All of the questions were answered and there is agreement to proceed.  Consent signed and in chart.  Thank you for this interesting consult.  I greatly enjoyed meeting AHLEAH SIMKO and look forward to participating in their care.  A copy of this report was sent to the  requesting provider on this date.  Electronically Signed: Sharlet DELENA Candle, PA-C 01/15/2024, 7:22 AM   I spent a total of  30 Minutes   in face to face in clinical consultation, greater than 50% of which was counseling/coordinating care for left hip mass biopsy

## 2024-01-16 LAB — SURGICAL PATHOLOGY

## 2024-02-04 ENCOUNTER — Ambulatory Visit (INDEPENDENT_AMBULATORY_CARE_PROVIDER_SITE_OTHER): Payer: BC Managed Care – PPO

## 2024-02-04 DIAGNOSIS — G459 Transient cerebral ischemic attack, unspecified: Secondary | ICD-10-CM | POA: Diagnosis not present

## 2024-02-05 LAB — CUP PACEART REMOTE DEVICE CHECK
Date Time Interrogation Session: 20250223232201
Implantable Pulse Generator Implant Date: 20240522

## 2024-02-06 NOTE — Progress Notes (Signed)
 Carelink Summary Report / Loop Recorder

## 2024-02-13 ENCOUNTER — Telehealth: Payer: Self-pay | Admitting: *Deleted

## 2024-02-13 NOTE — Telephone Encounter (Signed)
 Pt is scheduled for tele preop appt 04/07/24. Med rec and consent are done.      Patient Consent for Virtual Visit        Cheryl Gallegos has provided verbal consent on 02/13/2024 for a virtual visit (video or telephone).   CONSENT FOR VIRTUAL VISIT FOR:  Cheryl Gallegos  By participating in this virtual visit I agree to the following:  I hereby voluntarily request, consent and authorize [facility] and its employed or contracted physicians, physician assistants, nurse practitioners or other licensed health care professionals (the Practitioner), to provide me with telemedicine health care services (the "Services") as deemed necessary by the treating Practitioner. I acknowledge and consent to receive the Services by the Practitioner via telemedicine. I understand that the telemedicine visit will involve communicating with the Practitioner through live audiovisual communication technology and the disclosure of certain medical information by electronic transmission. I acknowledge that I have been given the opportunity to request an in-person assessment or other available alternative prior to the telemedicine visit and am voluntarily participating in the telemedicine visit.  I understand that I have the right to withhold or withdraw my consent to the use of telemedicine in the course of my care at any time, without affecting my right to future care or treatment, and that the Practitioner or I may terminate the telemedicine visit at any time. I understand that I have the right to inspect all information obtained and/or recorded in the course of the telemedicine visit and may receive copies of available information for a reasonable fee.  I understand that some of the potential risks of receiving the Services via telemedicine include:  Delay or interruption in medical evaluation due to technological equipment failure or disruption; Information transmitted may not be sufficient (e.g. poor  resolution of images) to allow for appropriate medical decision making by the Practitioner; and/or  In rare instances, security protocols could fail, causing a breach of personal health information.  Furthermore, I acknowledge that it is my responsibility to provide information about my medical history, conditions and care that is complete and accurate to the best of my ability. I acknowledge that Practitioner's advice, recommendations, and/or decision may be based on factors not within their control, such as incomplete or inaccurate data provided by me or distortions of diagnostic images or specimens that may result from electronic transmissions. I understand that the practice of medicine is not an exact science and that Practitioner makes no warranties or guarantees regarding treatment outcomes. I acknowledge that a copy of this consent can be made available to me via my patient portal [facility] MyChart), or I can request a printed copy by calling the office of [facility].    I understand that my insurance will be billed for this visit.   I have read or had this consent read to me. I understand the contents of this consent, which adequately explains the benefits and risks of the Services being provided via telemedicine.  I have been provided ample opportunity to ask questions regarding this consent and the Services and have had my questions answered to my satisfaction. I give my informed consent for the services to be provided through the use of telemedicine in my medical care

## 2024-02-13 NOTE — Telephone Encounter (Signed)
 Patient with diagnosis of Afib on Eliquis for anticoagulation.    Procedure: Colonoscopy Date of procedure: 04/28/24   CHA2DS2-VASc Score = 5   This indicates a 7.2% annual risk of stroke. The patient's score is based upon: CHF History: 0 HTN History: 1 Diabetes History: 0 Stroke History: 2 Vascular Disease History: 0 Age Score: 1 Gender Score: 1     CrCl 70 mL/min Platelet count 239 K   Given hx of TIA last year recommend to hold Eliquis for 1 day  prior to procedure.   Patient will not need bridging with Lovenox (enoxaparin) around procedure.  **This guidance is not considered finalized until pre-operative APP has relayed final recommendations.**

## 2024-02-13 NOTE — Telephone Encounter (Signed)
 Pt is scheduled for tele preop appt 04/07/24. Med rec and consent are done.

## 2024-02-13 NOTE — Telephone Encounter (Signed)
   Name: Cheryl Gallegos  DOB: 1951/06/11  MRN: [id number]  Primary Cardiologist: None   Preoperative team, please contact this patient and set up a phone call appointment after 02/27/2024 for further preoperative risk assessment. Please obtain consent and complete medication review. Thank you for your help.  I confirm that guidance regarding antiplatelet and oral anticoagulation therapy has been completed and, if necessary, noted below.  Given hx of TIA last year recommend to hold Eliquis for 1 day  prior to procedure.   Patient will not need bridging with Lovenox (enoxaparin) around procedure.  I also confirmed the patient resides in the state of [place]. As per [facility] [employer] telemedicine laws, the patient must reside in the state in which the provider is licensed.   Denyce Robert, NP 02/13/2024, 8:19 AM [facility]

## 2024-03-10 ENCOUNTER — Ambulatory Visit (INDEPENDENT_AMBULATORY_CARE_PROVIDER_SITE_OTHER): Payer: BC Managed Care – PPO

## 2024-03-10 DIAGNOSIS — G459 Transient cerebral ischemic attack, unspecified: Secondary | ICD-10-CM | POA: Diagnosis not present

## 2024-03-10 NOTE — Progress Notes (Signed)
 Carelink Summary Report / Loop Recorder

## 2024-03-10 NOTE — Addendum Note (Signed)
 Addended by: Geralyn Flash D on: 03/10/2024 04:29 PM   Modules accepted: Orders

## 2024-03-14 LAB — CUP PACEART REMOTE DEVICE CHECK
Date Time Interrogation Session: 20250330231619
Implantable Pulse Generator Implant Date: 20240522

## 2024-03-20 ENCOUNTER — Encounter: Payer: Self-pay | Admitting: Neurology

## 2024-03-20 ENCOUNTER — Ambulatory Visit: Payer: Medicare Other | Admitting: Neurology

## 2024-03-20 VITALS — BP 149/87 | HR 94 | Ht 69.0 in | Wt 136.0 lb

## 2024-03-20 DIAGNOSIS — R4781 Slurred speech: Secondary | ICD-10-CM

## 2024-03-20 DIAGNOSIS — I480 Paroxysmal atrial fibrillation: Secondary | ICD-10-CM

## 2024-03-20 DIAGNOSIS — E162 Hypoglycemia, unspecified: Secondary | ICD-10-CM | POA: Diagnosis not present

## 2024-03-20 NOTE — Patient Instructions (Signed)
 With continue current medications Please eat prior to your workout session Continue to follow with PCP Return as needed

## 2024-03-20 NOTE — Progress Notes (Signed)
[facility]  PATIENT: Cheryl Gallegos DOB: [birth year]  REQUESTING CLINICIAN: Shon Hale, * HISTORY FROM: Patient/Chart review  REASON FOR VISIT: TIA, aphasia    HISTORICAL  CHIEF COMPLAINT:  Chief Complaint  Patient presents with   Follow-up    Rm12, alone, TIA, APHASIA: pt doing well. She stated that she had a similar episode taken to urgent care in [place] and they stated due to sugar    INTERVAL HISTORY 03/20/2024 Patient presents today for follow-up, last visit was in a year ago.  Since then she has been doing fairly well.  Diagnosed with atrial fibrillation, on Eliquis, and metoprolol for rate control.  She tells me she was doing well until January of this year when she had an episode of slurred speech.  She tells me during that morning, she took her Fosamax, did not eat anything went to work out (she usually works out in the afternoon) and while talking to her husband she was slurring her words.  He was taken to the hospital, her CBC showed glucose of 61. Her head CT and CTA did not show any acute abnormalities. Her symptoms improved.  Since then, she has been doing well.  No other complaints, no other concerns.   HISTORY OF PRESENT ILLNESS:  This is a 73 year old woman with past medical history of Hypertension, Hyperlipidemia, who is presenting after an episode of aphasia lasting about 3 min, inability to produce speech, followed a slurred speech and feeling globally overwhelmed. Patient stated the episode started while doing her grade (Grading papers), she went down to get the attention of her husband and was able to say "GET HERE NOW". By the time they reached the ED, symptoms resolved. Her CT CTA was negative, and she was unable to get a MRI Brain due to shrapnel in her body. She was diagnosed with TIA, completed a 21 days of DAPT, Aspirin and Plavix and currently on Aspirin alone. She also completed a 21 days cardiac monitor and pending follow up  with cardiology next week.  She never experienced an similar episode in the past and since then, she has been doing good.   OTHER MEDICAL CONDITIONS: Hypertension, hyperlipidemia    REVIEW OF SYSTEMS: Full 14 system review of systems performed and negative with exception of: As noted in the HPI   ALLERGIES: Allergies  Allergen Reactions   Beta Adrenergic Blockers Other (See Comments)    Orthostatic hypotension   Ibuprofen Swelling   Tomato Hives   Chocolate Swelling and Rash    HOME MEDICATIONS: Outpatient Medications Prior to Visit  Medication Sig Dispense Refill   alendronate (FOSAMAX) 70 MG tablet Take 70 mg by mouth every Thursday.     amLODipine (NORVASC) 5 MG tablet Take 5 mg by mouth daily.     apixaban (ELIQUIS) 5 MG TABS tablet TAKE 1 TABLET BY MOUTH TWICE A DAY 60 tablet 6   clindamycin-benzoyl peroxide (BENZACLIN) gel Apply 1 Application topically as needed.     lisinopril (ZESTRIL) 20 MG tablet Take 1 tablet (20 mg total) by mouth daily.     metoprolol succinate (TOPROL XL) 25 MG 24 hr tablet Take 1 tablet (25 mg total) by mouth daily. Take with or immediately following a meal. 30 tablet 6   minocycline (MINOCIN) 50 MG capsule Take 50 mg by mouth as needed (for rash/hives).     rosuvastatin (CRESTOR) 20 MG tablet Take 1 tablet (20 mg total) by mouth daily. 60 tablet 0  No facility-administered medications prior to visit.    PAST MEDICAL HISTORY: Past Medical History:  Diagnosis Date   Hypertension     PAST SURGICAL HISTORY: Past Surgical History:  Procedure Laterality Date   ABDOMINAL HYSTERECTOMY     APPENDECTOMY     BREAST BIOPSY Left 08/07/2023   MM LT BREAST BX W LOC DEV 1ST LESION IMAGE BX SPEC STEREO GUIDE 08/07/2023 [facility]   BREAST EXCISIONAL BIOPSY Left 1975   benign    FAMILY HISTORY: Family History  Problem Relation Age of Onset   Bone cancer Mother    Aneurysm Father    Hypertension Brother     SOCIAL HISTORY: Social  History   Socioeconomic History   Marital status: Married    Spouse name: Not on file   Number of children: Not on file   Years of education: Not on file   Highest education level: Not on file  Occupational History   Not on file  Tobacco Use   Smoking status: Never   Smokeless tobacco: Never   Tobacco comments:    Never smoked 08/02/23  Substance and Sexual Activity   Alcohol use: Yes    Alcohol/week: 6.0 standard drinks of alcohol    Types: 6 Glasses of wine per week    Comment: 1 glass nightly 08/02/23   Drug use: Never   Sexual activity: Not on file  Other Topics Concern   Not on file  Social History Narrative   Not on file   Social Drivers of Health   Financial Resource Strain: Not on file  Food Insecurity: Not on file  Transportation Needs: Not on file  Physical Activity: Not on file  Stress: Not on file  Social Connections: Not on file  Intimate Partner Violence: Not on file     PHYSICAL EXAM   GENERAL EXAM/CONSTITUTIONAL: Vitals:  Vitals:   03/20/24 1023  BP: (!) 149/87  Pulse: 94  Weight: 136 lb 0.4 oz (61.7 kg)  Height: 5\' 9"  (1.753 m)   Body mass index is 20.09 kg/m. Wt Readings from Last 3 Encounters:  03/20/24 136 lb 0.4 oz (61.7 kg)  01/15/24 136 lb (61.7 kg)  01/03/24 135 lb (61.2 kg)   Patient is in no distress; well developed, nourished and groomed; neck is supple  MUSCULOSKELETAL: Gait, strength, tone, movements noted in Neurologic exam below  NEUROLOGIC: MENTAL STATUS:      No data to display         awake, alert, oriented to person, place and time recent and remote memory intact normal attention and concentration language fluent, comprehension intact, naming intact. Able to name, repeat and comprehend fund of knowledge appropriate  CRANIAL NERVE:  2nd, 3rd, 4th, 6th - Visual fields full to confrontation, extraocular muscles intact, no nystagmus 5th - facial sensation symmetric 7th - facial strength symmetric 8th -  hearing intact 9th - palate elevates symmetrically, uvula midline 11th - shoulder shrug symmetric 12th - tongue protrusion midline  MOTOR:  normal bulk and tone, full strength in the BUE, BLE  SENSORY:  normal and symmetric to light touch  COORDINATION:  finger-nose-finger, fine finger movements normal  GAIT/STATION:  normal  DIAGNOSTIC DATA (LABS, IMAGING, TESTING) - I reviewed patient records, labs, notes, testing and imaging myself where available.  Lab Results  Component Value Date   WBC 5.8 01/15/2024   HGB 14.8 01/15/2024   HCT 43.6 01/15/2024   MCV 94.2 01/15/2024   PLT 239 01/15/2024  Component Value Date/Time   NA 135 01/03/2024 1143   K 3.9 01/03/2024 1143   CL 100 01/03/2024 1143   CO2 24 01/03/2024 1143   GLUCOSE 101 (H) 01/03/2024 1143   BUN 12 01/03/2024 1143   CREATININE 0.71 01/03/2024 1143   CALCIUM 9.4 01/03/2024 1143   PROT 7.3 01/03/2024 1143   ALBUMIN 4.8 01/03/2024 1143   AST 26 01/03/2024 1143   ALT 27 01/03/2024 1143   ALKPHOS 50 01/03/2024 1143   BILITOT 0.8 01/03/2024 1143   GFRNONAA >60 01/03/2024 1143   Lab Results  Component Value Date   CHOL 204 (H) 02/19/2023   HDL 88 02/19/2023   LDLCALC 108 (H) 02/19/2023   TRIG 41 02/19/2023   CHOLHDL 2.3 02/19/2023   Lab Results  Component Value Date   HGBA1C 5.5 02/19/2023   No results found for: "VITAMINB12" No results found for: "TSH"  CT Head 02/17/2022 No evidence of acute intracranial abnormality   CT Head 01/03/2024 1. No acute intracranial abnormality or significant interval change. 2. Stable atrophy and white matter disease. This likely reflects the sequela of chronic microvascular ischemia. 3. Aspects is 10/10  CTA neck 02/17/2022 1. The common carotid, internal carotid and vertebral arteries are patent within the neck without stenosis. Mild atherosclerotic plaque within the proximal left ICA. 2.  Aortic Atherosclerosis (ICD10-I70.0). 3. Indeterminate 2.1 x 1.4 cm  lesion within the right parotid gland. A targeted ultrasound is recommended for further evaluation.   CTA head 02/17/2022: 1. No intracranial large vessel occlusion or proximal high-grade arterial stenosis identified. 2. Atherosclerotic plaque within the intracranial internal carotid arteries with no more than mild stenosis.  CTA Head and Neck 01/03/2024 1. No emergent large vessel occlusion. 2. Mild tortuosity of the cervical internal carotid arteries bilaterally without focal stenosis. This likely reflects chronic hypertension. 3. Multilevel degenerative changes of the cervical spine. 4.  Aortic Atherosclerosis (ICD10-I70.0).   Routine EEG 04/05/2023: Normal   ASSESSMENT AND PLAN  73 y.o. year old female with hypertension, hyperlipidemia who is presenting after for follow-up after her episode of aphasia in 2024.  At that time we had a EEG was normal.  She tells me in January 2025, she did have another episode where she was slurring her words but this was in the setting of hypoglycemia, her sugar was 61.   She was found to have atrial fibrillation, currently on Eliquis and metoprolol for rate control.  At this point no further neurological workup indicated, advised patient to continue current medications, continue follow-up per providers and to return as needed.  She voiced understanding.   1. Paroxysmal atrial fibrillation (HCC)   2. Slurred speech   3. Hypoglycemia      Patient Instructions  With continue current medications Please eat prior to your workout session Continue to follow with PCP Return as needed   No orders of the defined types were placed in this encounter.   No orders of the defined types were placed in this encounter.   Return if symptoms worsen or fail to improve.  I have spent a total of 30 minutes dedicated to this patient today, preparing to see patient, performing a medically appropriate examination and evaluation, ordering tests and/or medications and  procedures, and counseling and educating the patient/family/caregiver; independently interpreting result and communicating results to the family/patient/caregiver; and documenting clinical information in the electronic medical record.   Windell Norfolk, MD 03/20/2024, 10:51 AM  [facility] [address], [address] [place], [place] [address] [phone]  

## 2024-04-06 NOTE — Progress Notes (Unsigned)
 Virtual Visit via Telephone Note   Because of TABER SANKEY co-morbid illnesses, she is at least at moderate risk for complications without adequate follow up.  This format is felt to be most appropriate for this patient at this time.  Due to technical limitations with video connection (technology), today's appointment will be conducted as an audio only telehealth visit, and [name] verbally agreed to proceed in this manner.   All issues noted in this document were discussed and addressed.  No physical exam could be performed with this format.  Evaluation Performed:  Preoperative cardiovascular risk assessment _____________   Date:  04/06/2024   Patient ID:  Cheryl Gallegos, DOB 1951-04-13, MRN [id number] Patient Location:  Home Provider location:   Office  Primary Care Provider:  Ransom Byers, MD Primary Cardiologist:  None  Chief Complaint / Patient Profile   72 y.o. y/o female with a h/o TIA, hypertension, hyperlipidemia who is pending colonoscopy and presents today for telephonic preoperative cardiovascular risk assessment.  History of Present Illness    Cheryl Gallegos is a 73 y.o. female who presents via audio/video conferencing for a telehealth visit today.  Pt was last seen in cardiology clinic on 05/02/2023 by Camnitz.  At that time AERION CASCIANO was doing well .  The patient is now pending procedure as outlined above. Since her last visit, she continues to be stable from a cardiac standpoint.  Today she denies chest pain, shortness of breath, lower extremity edema, fatigue, palpitations, melena, hematuria, hemoptysis, diaphoresis, weakness, presyncope, syncope, orthopnea, and PND.   Past Medical History    Past Medical History:  Diagnosis Date   Hypertension    Past Surgical History:  Procedure Laterality Date   ABDOMINAL HYSTERECTOMY     APPENDECTOMY     BREAST BIOPSY Left 08/07/2023   MM LT BREAST BX W LOC DEV 1ST LESION IMAGE BX  SPEC STEREO GUIDE 08/07/2023 [facility]   BREAST EXCISIONAL BIOPSY Left 1975   benign    Allergies  Allergies  Allergen Reactions   Beta Adrenergic Blockers Other (See Comments)    Orthostatic hypotension   Ibuprofen Swelling   Tomato Hives   Chocolate Swelling and Rash    Home Medications    Prior to Admission medications   Medication Sig Start Date End Date Taking? Authorizing Provider  alendronate (FOSAMAX) 70 MG tablet Take 70 mg by mouth every Thursday.    [provider]  amLODipine (NORVASC) 5 MG tablet Take 5 mg by mouth daily.    [provider]  apixaban  (ELIQUIS ) 5 MG TABS tablet TAKE 1 TABLET BY MOUTH TWICE A DAY 11/28/23   Nathanel Bal, PA-C  clindamycin-benzoyl peroxide (BENZACLIN) gel Apply 1 Application topically as needed. 12/11/23   [provider]  lisinopril  (ZESTRIL ) 20 MG tablet Take 1 tablet (20 mg total) by mouth daily. 02/22/23   Kraig Peru, MD  metoprolol  succinate (TOPROL  XL) 25 MG 24 hr tablet Take 1 tablet (25 mg total) by mouth daily. Take with or immediately following a meal. 12/18/23   Nathanel Bal, PA-C  minocycline (MINOCIN) 50 MG capsule Take 50 mg by mouth as needed (for rash/hives). 02/03/23   [provider]  rosuvastatin  (CRESTOR ) 20 MG tablet Take 1 tablet (20 mg total) by mouth daily. 02/20/23   Kraig Peru, MD    Physical Exam    Vital Signs:  RENAYA HOLTHAUS does not have vital signs available for review  today.  Given telephonic nature of communication, physical exam is limited. AAOx3. NAD. Normal affect.  Speech and respirations are unlabored.  Accessory Clinical Findings    None  Assessment & Plan    1.  Preoperative Cardiovascular Risk Assessment:Procedure:   Colonoscopy   Date of Surgery:  Clearance 04/28/24                                  Surgeon:  Dr. Derenda Flax Surgeon's Group or Practice Name:  [facility]  Phone number:   [phone] Fax number:  [phone]    Primary Cardiologist: Dr. Lawana Pray  Chart reviewed as part of pre-operative protocol coverage. Given past medical history and time since last visit, based on ACC/AHA guidelines, EMIKO TALLERICO would be at acceptable risk for the planned procedure without further cardiovascular testing.   Patient was advised that if she develops new symptoms prior to surgery to contact our office to arrange a follow-up appointment.  He verbalized understanding.  Given hx of TIA last year recommend to hold Eliquis  for 1 day  prior to procedure.   Patient will not need bridging with Lovenox  (enoxaparin ) around procedure.  I will route this recommendation to the requesting party via Epic fax function and remove from pre-op pool.      Time:   Today, I have spent 9 minutes with the patient with telehealth technology discussing medical history, symptoms, and management plan.  I spent 10 minutes reviewing past medical history, cardiac medications, and cardiac testing   Carie Charity, NP  04/06/2024, 6:44 PM

## 2024-04-07 ENCOUNTER — Ambulatory Visit: Attending: Cardiovascular Disease

## 2024-04-07 DIAGNOSIS — Z01810 Encounter for preprocedural cardiovascular examination: Secondary | ICD-10-CM

## 2024-04-10 DIAGNOSIS — H04123 Dry eye syndrome of bilateral lacrimal glands: Secondary | ICD-10-CM | POA: Diagnosis not present

## 2024-04-10 DIAGNOSIS — H2513 Age-related nuclear cataract, bilateral: Secondary | ICD-10-CM | POA: Diagnosis not present

## 2024-04-10 DIAGNOSIS — H353111 Nonexudative age-related macular degeneration, right eye, early dry stage: Secondary | ICD-10-CM | POA: Diagnosis not present

## 2024-04-10 DIAGNOSIS — H35033 Hypertensive retinopathy, bilateral: Secondary | ICD-10-CM | POA: Diagnosis not present

## 2024-04-14 ENCOUNTER — Ambulatory Visit (INDEPENDENT_AMBULATORY_CARE_PROVIDER_SITE_OTHER): Payer: Self-pay

## 2024-04-14 DIAGNOSIS — G459 Transient cerebral ischemic attack, unspecified: Secondary | ICD-10-CM | POA: Diagnosis not present

## 2024-04-14 LAB — CUP PACEART REMOTE DEVICE CHECK
Date Time Interrogation Session: 20250504232915
Implantable Pulse Generator Implant Date: 20240522

## 2024-04-15 DIAGNOSIS — E785 Hyperlipidemia, unspecified: Secondary | ICD-10-CM | POA: Diagnosis not present

## 2024-04-15 DIAGNOSIS — I10 Essential (primary) hypertension: Secondary | ICD-10-CM | POA: Diagnosis not present

## 2024-04-15 DIAGNOSIS — M810 Age-related osteoporosis without current pathological fracture: Secondary | ICD-10-CM | POA: Diagnosis not present

## 2024-04-15 DIAGNOSIS — Z809 Family history of malignant neoplasm, unspecified: Secondary | ICD-10-CM | POA: Diagnosis not present

## 2024-04-15 DIAGNOSIS — I700 Atherosclerosis of aorta: Secondary | ICD-10-CM | POA: Diagnosis not present

## 2024-04-15 DIAGNOSIS — Z8249 Family history of ischemic heart disease and other diseases of the circulatory system: Secondary | ICD-10-CM | POA: Diagnosis not present

## 2024-04-15 DIAGNOSIS — D6869 Other thrombophilia: Secondary | ICD-10-CM | POA: Diagnosis not present

## 2024-04-15 DIAGNOSIS — Z7901 Long term (current) use of anticoagulants: Secondary | ICD-10-CM | POA: Diagnosis not present

## 2024-04-15 DIAGNOSIS — I4891 Unspecified atrial fibrillation: Secondary | ICD-10-CM | POA: Diagnosis not present

## 2024-04-22 NOTE — Addendum Note (Signed)
 Addended by: Edra Govern D on: 04/22/2024 04:15 PM   Modules accepted: Orders

## 2024-04-22 NOTE — Progress Notes (Signed)
 Carelink Summary Report / Loop Recorder

## 2024-04-28 ENCOUNTER — Ambulatory Visit: Payer: Self-pay | Admitting: Cardiology

## 2024-04-28 DIAGNOSIS — Z79899 Other long term (current) drug therapy: Secondary | ICD-10-CM | POA: Diagnosis not present

## 2024-04-28 DIAGNOSIS — I4891 Unspecified atrial fibrillation: Secondary | ICD-10-CM | POA: Diagnosis not present

## 2024-04-28 DIAGNOSIS — Z8673 Personal history of transient ischemic attack (TIA), and cerebral infarction without residual deficits: Secondary | ICD-10-CM | POA: Diagnosis not present

## 2024-04-28 DIAGNOSIS — Z1211 Encounter for screening for malignant neoplasm of colon: Secondary | ICD-10-CM | POA: Diagnosis not present

## 2024-04-28 DIAGNOSIS — D122 Benign neoplasm of ascending colon: Secondary | ICD-10-CM | POA: Diagnosis not present

## 2024-04-28 DIAGNOSIS — I10 Essential (primary) hypertension: Secondary | ICD-10-CM | POA: Diagnosis not present

## 2024-04-28 DIAGNOSIS — D125 Benign neoplasm of sigmoid colon: Secondary | ICD-10-CM | POA: Diagnosis not present

## 2024-04-28 DIAGNOSIS — Z7901 Long term (current) use of anticoagulants: Secondary | ICD-10-CM | POA: Diagnosis not present

## 2024-04-28 DIAGNOSIS — Z860101 Personal history of adenomatous and serrated colon polyps: Secondary | ICD-10-CM | POA: Diagnosis not present

## 2024-05-15 ENCOUNTER — Ambulatory Visit (INDEPENDENT_AMBULATORY_CARE_PROVIDER_SITE_OTHER): Payer: Self-pay

## 2024-05-15 DIAGNOSIS — G459 Transient cerebral ischemic attack, unspecified: Secondary | ICD-10-CM

## 2024-05-15 LAB — CUP PACEART REMOTE DEVICE CHECK
Date Time Interrogation Session: 20250604232833
Implantable Pulse Generator Implant Date: 20240522

## 2024-05-26 ENCOUNTER — Ambulatory Visit: Payer: Self-pay | Admitting: Cardiology

## 2024-05-30 NOTE — Progress Notes (Signed)
 Carelink Summary Report / Loop Recorder

## 2024-06-01 ENCOUNTER — Other Ambulatory Visit (HOSPITAL_COMMUNITY): Payer: Self-pay | Admitting: Internal Medicine

## 2024-06-10 ENCOUNTER — Other Ambulatory Visit: Payer: Self-pay | Admitting: Family Medicine

## 2024-06-10 DIAGNOSIS — Z1231 Encounter for screening mammogram for malignant neoplasm of breast: Secondary | ICD-10-CM

## 2024-06-11 DIAGNOSIS — Z87828 Personal history of other (healed) physical injury and trauma: Secondary | ICD-10-CM | POA: Diagnosis not present

## 2024-06-11 DIAGNOSIS — M8588 Other specified disorders of bone density and structure, other site: Secondary | ICD-10-CM | POA: Diagnosis not present

## 2024-06-16 ENCOUNTER — Ambulatory Visit (INDEPENDENT_AMBULATORY_CARE_PROVIDER_SITE_OTHER): Payer: Self-pay

## 2024-06-16 DIAGNOSIS — G459 Transient cerebral ischemic attack, unspecified: Secondary | ICD-10-CM

## 2024-06-16 LAB — CUP PACEART REMOTE DEVICE CHECK
Date Time Interrogation Session: 20250705232150
Implantable Pulse Generator Implant Date: 20240522

## 2024-06-20 ENCOUNTER — Other Ambulatory Visit (HOSPITAL_COMMUNITY): Payer: Self-pay | Admitting: Internal Medicine

## 2024-07-01 ENCOUNTER — Telehealth: Payer: Self-pay | Admitting: Cardiology

## 2024-07-01 DIAGNOSIS — R0683 Snoring: Secondary | ICD-10-CM

## 2024-07-01 DIAGNOSIS — I480 Paroxysmal atrial fibrillation: Secondary | ICD-10-CM

## 2024-07-01 NOTE — Progress Notes (Signed)
 Carelink Summary Report / Loop Recorder

## 2024-07-01 NOTE — Telephone Encounter (Signed)
 Dr. Chrystal would like to have an HST ordered, referral in system

## 2024-07-12 ENCOUNTER — Ambulatory Visit: Payer: Self-pay | Admitting: Cardiology

## 2024-07-17 ENCOUNTER — Ambulatory Visit (INDEPENDENT_AMBULATORY_CARE_PROVIDER_SITE_OTHER): Payer: Self-pay

## 2024-07-17 ENCOUNTER — Ambulatory Visit: Payer: Self-pay | Admitting: Cardiology

## 2024-07-17 DIAGNOSIS — G459 Transient cerebral ischemic attack, unspecified: Secondary | ICD-10-CM

## 2024-07-17 LAB — CUP PACEART REMOTE DEVICE CHECK
Date Time Interrogation Session: 20250805232729
Implantable Pulse Generator Implant Date: 20240522

## 2024-07-22 NOTE — Telephone Encounter (Signed)
 Please place order for HST, [facility] responsible for authorization

## 2024-07-24 ENCOUNTER — Ambulatory Visit
Admission: RE | Admit: 2024-07-24 | Discharge: 2024-07-24 | Disposition: A | Discharge status: Home / Self Care | Source: Ambulatory Visit | Attending: Family Medicine | Admitting: Family Medicine

## 2024-07-24 DIAGNOSIS — Z1231 Encounter for screening mammogram for malignant neoplasm of breast: Secondary | ICD-10-CM | POA: Diagnosis not present

## 2024-07-25 NOTE — Telephone Encounter (Signed)
 Itamar HST ordered as requested by Dr. Chrystal.

## 2024-08-18 ENCOUNTER — Ambulatory Visit (INDEPENDENT_AMBULATORY_CARE_PROVIDER_SITE_OTHER): Payer: Self-pay

## 2024-08-18 DIAGNOSIS — G459 Transient cerebral ischemic attack, unspecified: Secondary | ICD-10-CM

## 2024-08-18 LAB — CUP PACEART REMOTE DEVICE CHECK
Date Time Interrogation Session: 20250907233801
Implantable Pulse Generator Implant Date: 20240522

## 2024-08-19 ENCOUNTER — Ambulatory Visit: Payer: Self-pay | Admitting: Cardiology

## 2024-08-28 NOTE — Progress Notes (Signed)
 Remote Loop Recorder Transmission

## 2024-09-01 DIAGNOSIS — L821 Other seborrheic keratosis: Secondary | ICD-10-CM | POA: Diagnosis not present

## 2024-09-01 DIAGNOSIS — Z86006 Personal history of melanoma in-situ: Secondary | ICD-10-CM | POA: Diagnosis not present

## 2024-09-01 DIAGNOSIS — D485 Neoplasm of uncertain behavior of skin: Secondary | ICD-10-CM | POA: Diagnosis not present

## 2024-09-01 DIAGNOSIS — Z08 Encounter for follow-up examination after completed treatment for malignant neoplasm: Secondary | ICD-10-CM | POA: Diagnosis not present

## 2024-09-01 DIAGNOSIS — Z85828 Personal history of other malignant neoplasm of skin: Secondary | ICD-10-CM | POA: Diagnosis not present

## 2024-09-01 DIAGNOSIS — Z7189 Other specified counseling: Secondary | ICD-10-CM | POA: Diagnosis not present

## 2024-09-01 DIAGNOSIS — L820 Inflamed seborrheic keratosis: Secondary | ICD-10-CM | POA: Diagnosis not present

## 2024-09-01 DIAGNOSIS — Z872 Personal history of diseases of the skin and subcutaneous tissue: Secondary | ICD-10-CM | POA: Diagnosis not present

## 2024-09-01 DIAGNOSIS — L814 Other melanin hyperpigmentation: Secondary | ICD-10-CM | POA: Diagnosis not present

## 2024-09-05 NOTE — Progress Notes (Signed)
 Remote Loop Recorder Transmission

## 2024-09-18 ENCOUNTER — Ambulatory Visit (INDEPENDENT_AMBULATORY_CARE_PROVIDER_SITE_OTHER): Payer: Self-pay

## 2024-09-18 DIAGNOSIS — G459 Transient cerebral ischemic attack, unspecified: Secondary | ICD-10-CM

## 2024-09-18 LAB — CUP PACEART REMOTE DEVICE CHECK
Date Time Interrogation Session: 20251008232847
Implantable Pulse Generator Implant Date: 20240522

## 2024-09-19 ENCOUNTER — Ambulatory Visit: Payer: Self-pay | Admitting: Cardiology

## 2024-09-19 NOTE — Progress Notes (Signed)
 Remote Loop Recorder Transmission

## 2024-09-23 NOTE — Progress Notes (Signed)
 Remote Loop Recorder Transmission

## 2024-09-26 ENCOUNTER — Telehealth: Payer: Self-pay

## 2024-09-26 NOTE — Telephone Encounter (Signed)
 Ordering provider: Dr. Shlomo  Associated diagnoses: Paroxysmal atrial fibrillation (HCC) [I48.0]; Snoring [R06.83]  Patient NOT notified of PIN (1234) on 09/26/2024   Phone note routed to covering staff for follow-up.

## 2024-09-30 NOTE — Telephone Encounter (Signed)
**Note De-Identified Chetan Mehring Obfuscation** Patient agreement reviewed and signed on 09/30/2024.  WatchPAT issued to patient on 09/30/2024 by Yevonne Yokum, Avelina HERO, LPN. Patient aware to not open the WatchPAT box until contacted with the activation PIN. Patient profile initialized in CloudPAT on 09/30/2024 by Avelina Catilyn Boggus, LPN. Device serial number: [id number]

## 2024-10-06 DIAGNOSIS — C4401 Basal cell carcinoma of skin of lip: Secondary | ICD-10-CM | POA: Diagnosis not present

## 2024-10-16 DIAGNOSIS — I480 Paroxysmal atrial fibrillation: Secondary | ICD-10-CM | POA: Diagnosis not present

## 2024-10-16 DIAGNOSIS — M8588 Other specified disorders of bone density and structure, other site: Secondary | ICD-10-CM | POA: Diagnosis not present

## 2024-10-16 DIAGNOSIS — I10 Essential (primary) hypertension: Secondary | ICD-10-CM | POA: Diagnosis not present

## 2024-10-16 DIAGNOSIS — D6869 Other thrombophilia: Secondary | ICD-10-CM | POA: Diagnosis not present

## 2024-10-16 DIAGNOSIS — Z0000 Encounter for general adult medical examination without abnormal findings: Secondary | ICD-10-CM | POA: Diagnosis not present

## 2024-10-16 DIAGNOSIS — Z23 Encounter for immunization: Secondary | ICD-10-CM | POA: Diagnosis not present

## 2024-10-16 DIAGNOSIS — M8580 Other specified disorders of bone density and structure, unspecified site: Secondary | ICD-10-CM | POA: Diagnosis not present

## 2024-10-16 DIAGNOSIS — E7800 Pure hypercholesterolemia, unspecified: Secondary | ICD-10-CM | POA: Diagnosis not present

## 2024-10-16 DIAGNOSIS — R220 Localized swelling, mass and lump, head: Secondary | ICD-10-CM | POA: Diagnosis not present

## 2024-10-19 ENCOUNTER — Ambulatory Visit (INDEPENDENT_AMBULATORY_CARE_PROVIDER_SITE_OTHER): Payer: Self-pay

## 2024-10-19 DIAGNOSIS — G459 Transient cerebral ischemic attack, unspecified: Secondary | ICD-10-CM | POA: Diagnosis not present

## 2024-10-19 LAB — CUP PACEART REMOTE DEVICE CHECK
Date Time Interrogation Session: 20251108231706
Implantable Pulse Generator Implant Date: 20240522

## 2024-10-21 ENCOUNTER — Ambulatory Visit: Payer: Self-pay | Admitting: Cardiology

## 2024-10-22 NOTE — Progress Notes (Signed)
 Remote Loop Recorder Transmission

## 2024-10-27 NOTE — Telephone Encounter (Signed)
**Note De-Identified Cheryl Gallegos Obfuscation** The pt came to the office to drop off the Itamar-HST device that Dr Shlomo ordered on 8/15. She stated that she did a quiz on the internet that asked her several questions concerning her sleep and that her results indicated that she is ok and does not require a Sleep study based on her symptoms.  I have cancelled the order from the pts appt desk and from [facility] and am forwarding this message to Dr Shlomo as a RICK.

## 2024-11-03 NOTE — Telephone Encounter (Signed)
**Note De-Identified Kenzel Ruesch Obfuscation** Shlomo Wilbert SAUNDERS, MD to Me (Selected Message)     10/27/24 11:19 AM Her physician recommended the sleep study because she has atrial fibrillation.  Please let her know that everybody he was diagnosed with atrial fibrillation they recommend sleep study and I would repeat the it Ahmar home sleep study

## 2024-11-03 NOTE — Telephone Encounter (Signed)
**Note De-Identified Laikyn Gewirtz Obfuscation** I called the pt to encourage her to do the Itamar-HST but the pt stated that she feels that it is not needed as she does not snore, have fatigue, or sleepiness during the daytime and declined to do the home sleep study.  Forwarding this note to Dr Shlomo as an RICK.

## 2024-11-03 NOTE — Telephone Encounter (Addendum)
**Note De-Identified Aireona Torelli Obfuscation** I called [facility] and advised Lucie that the pt declined to do a WatchPAT One-HST. Samantha verbalized understanding and stated that she will make Dr Suzen Rotunda aware.

## 2024-11-18 DIAGNOSIS — D490 Neoplasm of unspecified behavior of digestive system: Secondary | ICD-10-CM | POA: Diagnosis not present

## 2024-11-19 ENCOUNTER — Ambulatory Visit: Payer: Self-pay

## 2024-11-19 DIAGNOSIS — I480 Paroxysmal atrial fibrillation: Secondary | ICD-10-CM | POA: Diagnosis not present

## 2024-11-20 ENCOUNTER — Ambulatory Visit: Payer: Self-pay | Admitting: Cardiology

## 2024-11-20 LAB — CUP PACEART REMOTE DEVICE CHECK
Date Time Interrogation Session: 20251209233145
Implantable Pulse Generator Implant Date: 20240522

## 2024-11-20 NOTE — Addendum Note (Signed)
 Addended by: Marlisa Caridi A on: 11/20/2024 02:06 PM   Modules accepted: Orders

## 2024-11-20 NOTE — Progress Notes (Signed)
 Carelink Summary Report / Loop Recorder

## 2024-12-01 MED ORDER — METOPROLOL SUCCINATE ER 50 MG PO TB24: 50.0000 mg | ORAL_TABLET | Freq: Every day | ORAL | 3 refills | Status: AC

## 2024-12-17 ENCOUNTER — Ambulatory Visit (HOSPITAL_COMMUNITY)
Admission: RE | Admit: 2024-12-17 | Discharge: 2024-12-17 | Disposition: A | Discharge status: Home / Self Care | Source: Ambulatory Visit | Attending: Internal Medicine | Admitting: Internal Medicine

## 2024-12-17 ENCOUNTER — Encounter (HOSPITAL_COMMUNITY): Payer: Self-pay | Admitting: Internal Medicine

## 2024-12-17 VITALS — BP 134/84 | HR 71 | Ht 69.0 in | Wt 142.0 lb

## 2024-12-17 DIAGNOSIS — D6869 Other thrombophilia: Secondary | ICD-10-CM

## 2024-12-17 DIAGNOSIS — I480 Paroxysmal atrial fibrillation: Secondary | ICD-10-CM | POA: Diagnosis not present

## 2024-12-17 NOTE — Progress Notes (Signed)
 "   Primary Care Physician: Chrystal Lamarr RAMAN, MD Primary Cardiologist: None Electrophysiologist: Dr. Inocencio    Referring Physician: Device clinic     Cheryl Gallegos is a 74 y.o. female with a history of TIA s/p ILR placement by Dr. Inocencio on 05/02/23, HTN, HLD, and paroxysmal atrial fibrillation who presents for consultation in the [facility]. Device clinic alert on 8/8 for new two episodes of Afib, longest 1 hr and 58 minutes, burden 0.9%. Patient is on ASA. She has a CHADS2VASC score of 5.  On evaluation today, she is currently in NSR. She did not have any cardiac awareness of her Afib. She wears a Fitbit watch but it does not record ECG. She does not know if she snores and will ask her husband. She has an upcoming breast biopsy next week.   On follow up 10/9, she is currently in NSR. Review of ILR reports since last OV showed no arrhythmias on most recent device interrogation. She feels well overall and does not have any bleeding issues on Eliquis .   On follow up 12/18/23, she is currently in NSR. Review of ILR shows an episode of Afib that lasted for 8 hours on 12/01/23. Most recently, she had a 5 second episode on Christmas Eve and a 2 minute episode earlier this morning. Dr. Inocencio reviewed ILR data on 12/17 and recommended patient start Toprol  50 mg daily due to rapid PAF. She has not missed any doses of Eliquis .   Follow up 12/17/24, patient is currently in NSR.  ILR reviewed by Dr. Inocencio on 12/11 showed abnormal findings for rapid A-fib.  His recommendation was increase in Toprol  to 50 mg daily.  She has tolerated the medication adjustment without issue.  Last episode of A-fib was noted to be in December.  A-fib burden 1.7%.  No bleeding issues on Eliquis .  Today, she denies symptoms of palpitations, chest pain, shortness of breath, orthopnea, PND, lower extremity edema, dizziness, presyncope, syncope, snoring, daytime somnolence, bleeding, or  neurologic sequela. The patient is tolerating medications without difficulties and is otherwise without complaint today.    she has a BMI of Body mass index is 20.97 kg/m.SABRA Filed Weights   12/17/24 0938  Weight: 64.4 kg     Current Outpatient Medications  Medication Sig Dispense Refill   alendronate (FOSAMAX) 70 MG tablet Take 70 mg by mouth every Thursday.     amLODipine (NORVASC) 5 MG tablet Take 5 mg by mouth daily.     apixaban  (ELIQUIS ) 5 MG TABS tablet TAKE 1 TABLET BY MOUTH TWICE A DAY 60 tablet 11   clindamycin-benzoyl peroxide (BENZACLIN) gel Apply 1 Application topically as needed.     lisinopril  (ZESTRIL ) 20 MG tablet Take 1 tablet (20 mg total) by mouth daily.     metoprolol  succinate (TOPROL -XL) 50 MG 24 hr tablet Take 1 tablet (50 mg total) by mouth daily. Take with or immediately following a meal. 90 tablet 3   minocycline (MINOCIN) 50 MG capsule Take 50 mg by mouth as needed (for rash/hives).     rosuvastatin  (CRESTOR ) 20 MG tablet Take 1 tablet (20 mg total) by mouth daily. 60 tablet 0   No current facility-administered medications for this encounter.    Atrial Fibrillation Management history:  Previous antiarrhythmic drugs: None Previous cardioversions: None Previous ablations: None Anticoagulation history: Eliquis    ROS- All systems are reviewed and negative except as per the HPI above.  Physical Exam: BP 134/84   Pulse 71  Ht 5' 9 (1.753 m)   Wt 64.4 kg   BMI 20.97 kg/m   GEN- The patient is well appearing, alert and oriented x 3 today.   Neck - no JVD or carotid bruit noted Lungs- Clear to ausculation bilaterally, normal work of breathing Heart- Regular rate and rhythm, no murmurs, rubs or gallops, PMI not laterally displaced Extremities- no clubbing, cyanosis, or edema Skin - no rash or ecchymosis noted   EKG today demonstrates  EKG Interpretation Date/Time:  Wednesday December 17 2024 09:41:35 EST Ventricular Rate:  71 PR  Interval:  160 QRS Duration:  88 QT Interval:  414 QTC Calculation: 449 R Axis:   34  Text Interpretation: Normal sinus rhythm with sinus arrhythmia Normal ECG When compared with ECG of 03-Jan-2024 12:14, PREVIOUS ECG IS PRESENT Confirmed by Terra Pac ([id number]) on 12/17/2024 9:54:13 AM    Echo 02/19/23 demonstrated  1. Left ventricular ejection fraction, by estimation, is 55 to 60%. The  left ventricle has normal function. The left ventricle has no regional  wall motion abnormalities. Left ventricular diastolic parameters are  consistent with Grade II diastolic  dysfunction (pseudonormalization).   2. Right ventricular systolic function is normal. The right ventricular  size is normal.   3. Left atrial size was mild to moderately dilated.   4. Right atrial size was mildly dilated.   5. The mitral valve is normal in structure. Trivial mitral valve  regurgitation. No evidence of mitral stenosis.   6. The aortic valve is normal in structure. Aortic valve regurgitation is  mild. No aortic stenosis is present. Aortic valve area, by VTI measures  2.40 cm. Aortic valve mean gradient measures 3.0 mmHg. Aortic valve Vmax  measures 1.14 m/s.   7. Aortic dilatation noted. There is borderline dilatation of the aortic  root, measuring 37 mm.   8. The inferior vena cava is normal in size with greater than 50%  respiratory variability, suggesting right atrial pressure of 3 mmHg.   CHA2DS2-VASc Score = 5  The patient's score is based upon: CHF History: 0 HTN History: 1 Diabetes History: 0 Stroke History: 2 Vascular Disease History: 0 Age Score: 1 Gender Score: 1      ASSESSMENT AND PLAN: Paroxysmal Atrial Fibrillation (ICD10:  I48.0) The patient's CHA2DS2-VASc score is 5, indicating a 7.2% annual risk of stroke.    Patient is currently in NSR.  Review of ILR shows most recent episode was on 11/27/2024.  Patient has tolerated medication adjustment well.  Continue Toprol  50 mg  daily.  Secondary Hypercoagulable State (ICD10:  D68.69) The patient is at significant risk for stroke/thromboembolism based upon her CHA2DS2-VASc Score of 5.  Continue Apixaban  (Eliquis ).  Continue Eliquis  5 mg twice daily.  Okay to hold Eliquis  in anticipation of surgery and resume when appropriate.    Follow up Afib clinic 1 year.   Terra Pac, PA-C  Afib [facility] [address] [place], [place] [address] [phone]  "

## 2024-12-20 ENCOUNTER — Ambulatory Visit: Payer: Self-pay

## 2024-12-20 DIAGNOSIS — G459 Transient cerebral ischemic attack, unspecified: Secondary | ICD-10-CM

## 2024-12-22 LAB — CUP PACEART REMOTE DEVICE CHECK
Date Time Interrogation Session: 20260109231726
Implantable Pulse Generator Implant Date: 20240522

## 2024-12-23 ENCOUNTER — Ambulatory Visit: Payer: Self-pay | Admitting: Cardiology

## 2024-12-23 NOTE — Progress Notes (Signed)
 Remote Loop Recorder Transmission

## 2024-12-30 ENCOUNTER — Telehealth (HOSPITAL_BASED_OUTPATIENT_CLINIC_OR_DEPARTMENT_OTHER): Payer: Self-pay | Admitting: *Deleted

## 2024-12-30 NOTE — Telephone Encounter (Signed)
"  ° °  Pre-operative Risk Assessment    Patient Name: Cheryl Gallegos  DOB: May 27, 1951 MRN: [id number]   Date of last office visit: 12/17/24 FAIRY HEINRICH, [facility]; 05/02/23 DR. CAMNITZ;  Date of next office visit: NONE  Request for Surgical Clearance    Procedure:  1 DENTAL IMPLANT TO BE EXTRACTED ALONG WITH GUM SURGERY  Date of Surgery:  Clearance TBD                                Surgeon:  DR. MARVINA, DDS Surgeon's Group or Practice Name:  [facility] Phone number:  [phone] Fax number:  [phone]   Type of Clearance Requested:   - Medical  - Pharmacy:  Hold Apixaban  (Eliquis ) x 3-5 DAYS PRIOR   Type of Anesthesia:  Local  WITH POSSIBLE HALCION    Additional requests/questions:    Bonney Niels Jest   12/30/2024, 9:57 AM   "

## 2024-12-31 NOTE — Telephone Encounter (Addendum)
" ° °  Name: JANNE FAULK  DOB: Aug 25, 1951  MRN: [id number]  Primary Cardiologist: None - remotely Dr. Hobart, EP - Dr. Inocencio  Chart reviewed as part of pre-operative protocol coverage. Because of Etoy Mcdonnell Bracknell's past medical history and time since last visit, she will require a follow-up in-office visit in order to better assess preoperative cardiovascular risk. Last cardiology OV with [facility] was in >! Year ago in 04/2023 (has been following with afib clinic). Since she saw Dr. Hobart in 03/2023, this would be an established slot - OK to see gen cards APP.  Pre-op covering staff: - Please schedule appointment and call patient to inform them. If patient already had an upcoming appointment within acceptable timeframe, please add pre-op clearance to the appointment notes so provider is aware. - Please contact requesting surgeon's office via preferred method (i.e, phone, fax) to inform them of need for appointment prior to surgery.  Pharm input outlined below. Will need to review SBE ppx needs at upcoming OV but tentatively do not see anything in hx suggesting needed from cardiac standpoint.  Jakya Dovidio N Kaina Orengo, PA-C  12/31/2024, 8:24 AM   "

## 2024-12-31 NOTE — Telephone Encounter (Signed)
 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 For the time, I will remove this from our pool until the patient calls us  back.

## 2024-12-31 NOTE — Telephone Encounter (Signed)
 Patient with diagnosis of atrial fibrillation on Eliquis  for anticoagulation.    Procedure:  1 DENTAL IMPLANT TO BE EXTRACTED ALONG WITH GUM SURGERY   Date of Surgery:  Clearance TBD     CHA2DS2-VASc Score = 5   This indicates a 7.2% annual risk of stroke. The patient's score is based upon: CHF History: 0 HTN History: 1 Diabetes History: 0 Stroke History: 2 Vascular Disease History: 0 Age Score: 1 Gender Score: 1   Chart indicates TIA 03/2024  CrCl 69 Platelet count 239  Patient has not had an Afib/aflutter ablation in the last 3 months, DCCV within the last 4 weeks or a watchman implanted in the last 45 days   Patient does not require pre-op antibiotics for dental procedure.  Per office protocol, patient can hold Eliquis  for 2-3 days prior to procedure.   Patient will not need bridging with Lovenox  (enoxaparin ) around procedure.  **This guidance is not considered finalized until pre-operative APP has relayed final recommendations.**

## 2025-02-20 ENCOUNTER — Ambulatory Visit

## 2025-03-23 ENCOUNTER — Ambulatory Visit

## 2025-04-23 ENCOUNTER — Ambulatory Visit

## 2025-05-24 ENCOUNTER — Ambulatory Visit

## 2025-06-24 ENCOUNTER — Ambulatory Visit

## 2025-07-25 ENCOUNTER — Ambulatory Visit

## 2025-08-25 ENCOUNTER — Ambulatory Visit

## 2025-09-25 ENCOUNTER — Ambulatory Visit

## 2025-10-26 ENCOUNTER — Ambulatory Visit

## 2025-11-26 ENCOUNTER — Ambulatory Visit

## 2025-12-27 ENCOUNTER — Ambulatory Visit
